# Patient Record
Sex: Male | Born: 1945 | Race: White | Hispanic: No | Marital: Married | Smoking: Never smoker
Health system: Southern US, Community
[De-identification: ages and names within clinical notes are randomized; demographics above are authoritative.]

## PROBLEM LIST (undated history)

## (undated) DIAGNOSIS — G4730 Sleep apnea, unspecified: Secondary | ICD-10-CM

## (undated) DIAGNOSIS — R7303 Prediabetes: Secondary | ICD-10-CM

## (undated) DIAGNOSIS — I739 Peripheral vascular disease, unspecified: Secondary | ICD-10-CM

## (undated) DIAGNOSIS — I2510 Atherosclerotic heart disease of native coronary artery without angina pectoris: Secondary | ICD-10-CM

## (undated) DIAGNOSIS — M1990 Unspecified osteoarthritis, unspecified site: Secondary | ICD-10-CM

## (undated) DIAGNOSIS — G4733 Obstructive sleep apnea (adult) (pediatric): Secondary | ICD-10-CM

## (undated) DIAGNOSIS — I4891 Unspecified atrial fibrillation: Secondary | ICD-10-CM

## (undated) DIAGNOSIS — I10 Essential (primary) hypertension: Secondary | ICD-10-CM

## (undated) DIAGNOSIS — M1612 Unilateral primary osteoarthritis, left hip: Secondary | ICD-10-CM

## (undated) DIAGNOSIS — R569 Unspecified convulsions: Secondary | ICD-10-CM

## (undated) HISTORY — PX: CHOLECYSTECTOMY: SHX55

## (undated) HISTORY — PX: EYE SURGERY: SHX253

## (undated) HISTORY — PX: TONSILLECTOMY: SUR1361

---

## 2006-11-21 ENCOUNTER — Observation Stay (HOSPITAL_COMMUNITY): Admission: EM | Admit: 2006-11-21 | Discharge: 2006-11-22 | Payer: Self-pay | Admitting: Emergency Medicine

## 2007-03-17 DIAGNOSIS — I739 Peripheral vascular disease, unspecified: Secondary | ICD-10-CM

## 2007-03-17 HISTORY — DX: Peripheral vascular disease, unspecified: I73.9

## 2007-03-17 HISTORY — PX: JOINT REPLACEMENT: SHX530

## 2007-03-24 ENCOUNTER — Encounter: Admission: RE | Admit: 2007-03-24 | Discharge: 2007-03-24 | Payer: Self-pay | Admitting: Sports Medicine

## 2007-05-30 ENCOUNTER — Inpatient Hospital Stay (HOSPITAL_COMMUNITY): Admission: RE | Admit: 2007-05-30 | Discharge: 2007-06-01 | Payer: Self-pay | Admitting: Orthopedic Surgery

## 2007-06-04 ENCOUNTER — Emergency Department (HOSPITAL_COMMUNITY): Admission: EM | Admit: 2007-06-04 | Discharge: 2007-06-04 | Payer: Self-pay | Admitting: Emergency Medicine

## 2007-06-04 ENCOUNTER — Encounter (INDEPENDENT_AMBULATORY_CARE_PROVIDER_SITE_OTHER): Payer: Self-pay | Admitting: Emergency Medicine

## 2007-06-04 ENCOUNTER — Ambulatory Visit: Payer: Self-pay | Admitting: *Deleted

## 2008-09-20 IMAGING — CR DG HIP COMPLETE 2+V*R*
2 series · 2 of 2 positions shown · non-contrast
Comparison: none

CLINICAL DATA: Right hip replacement. 
 RIGHT HIP W/PELVIS ? 05/30/07:

[view not recorded (1 of 2)]
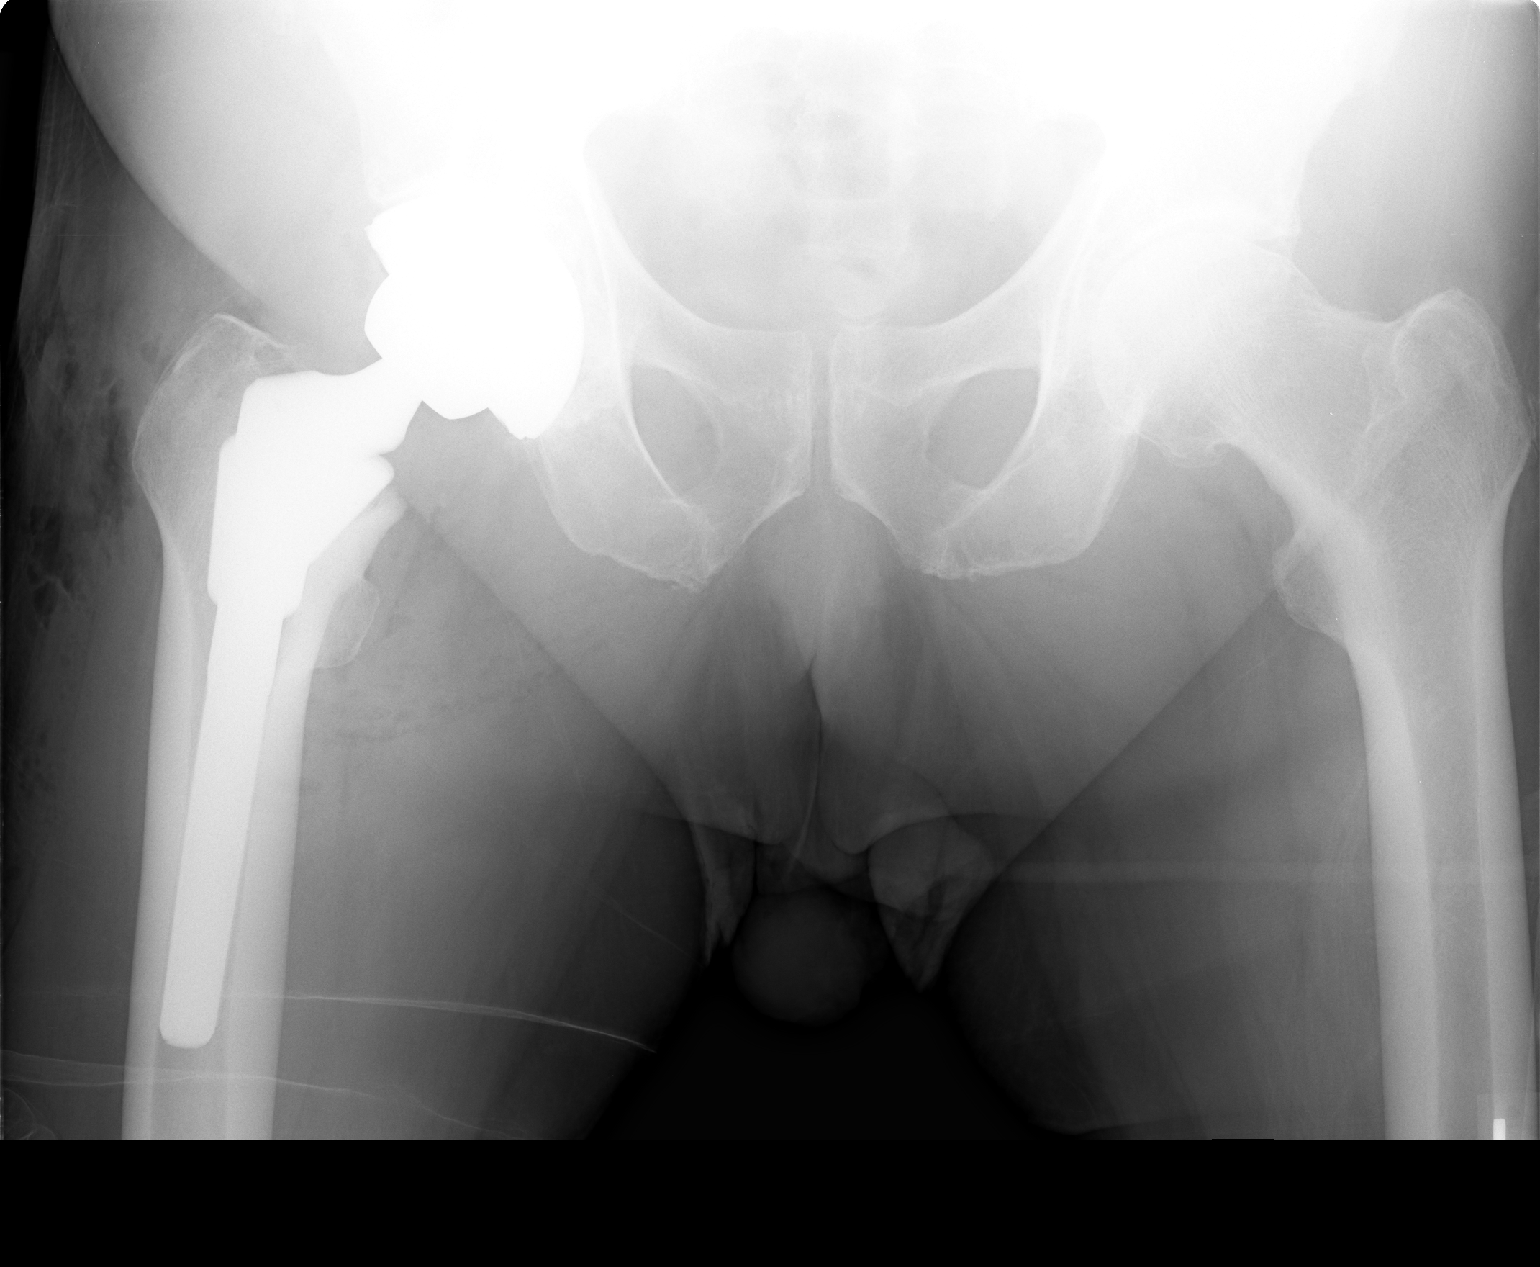

[view not recorded (2 of 2)]
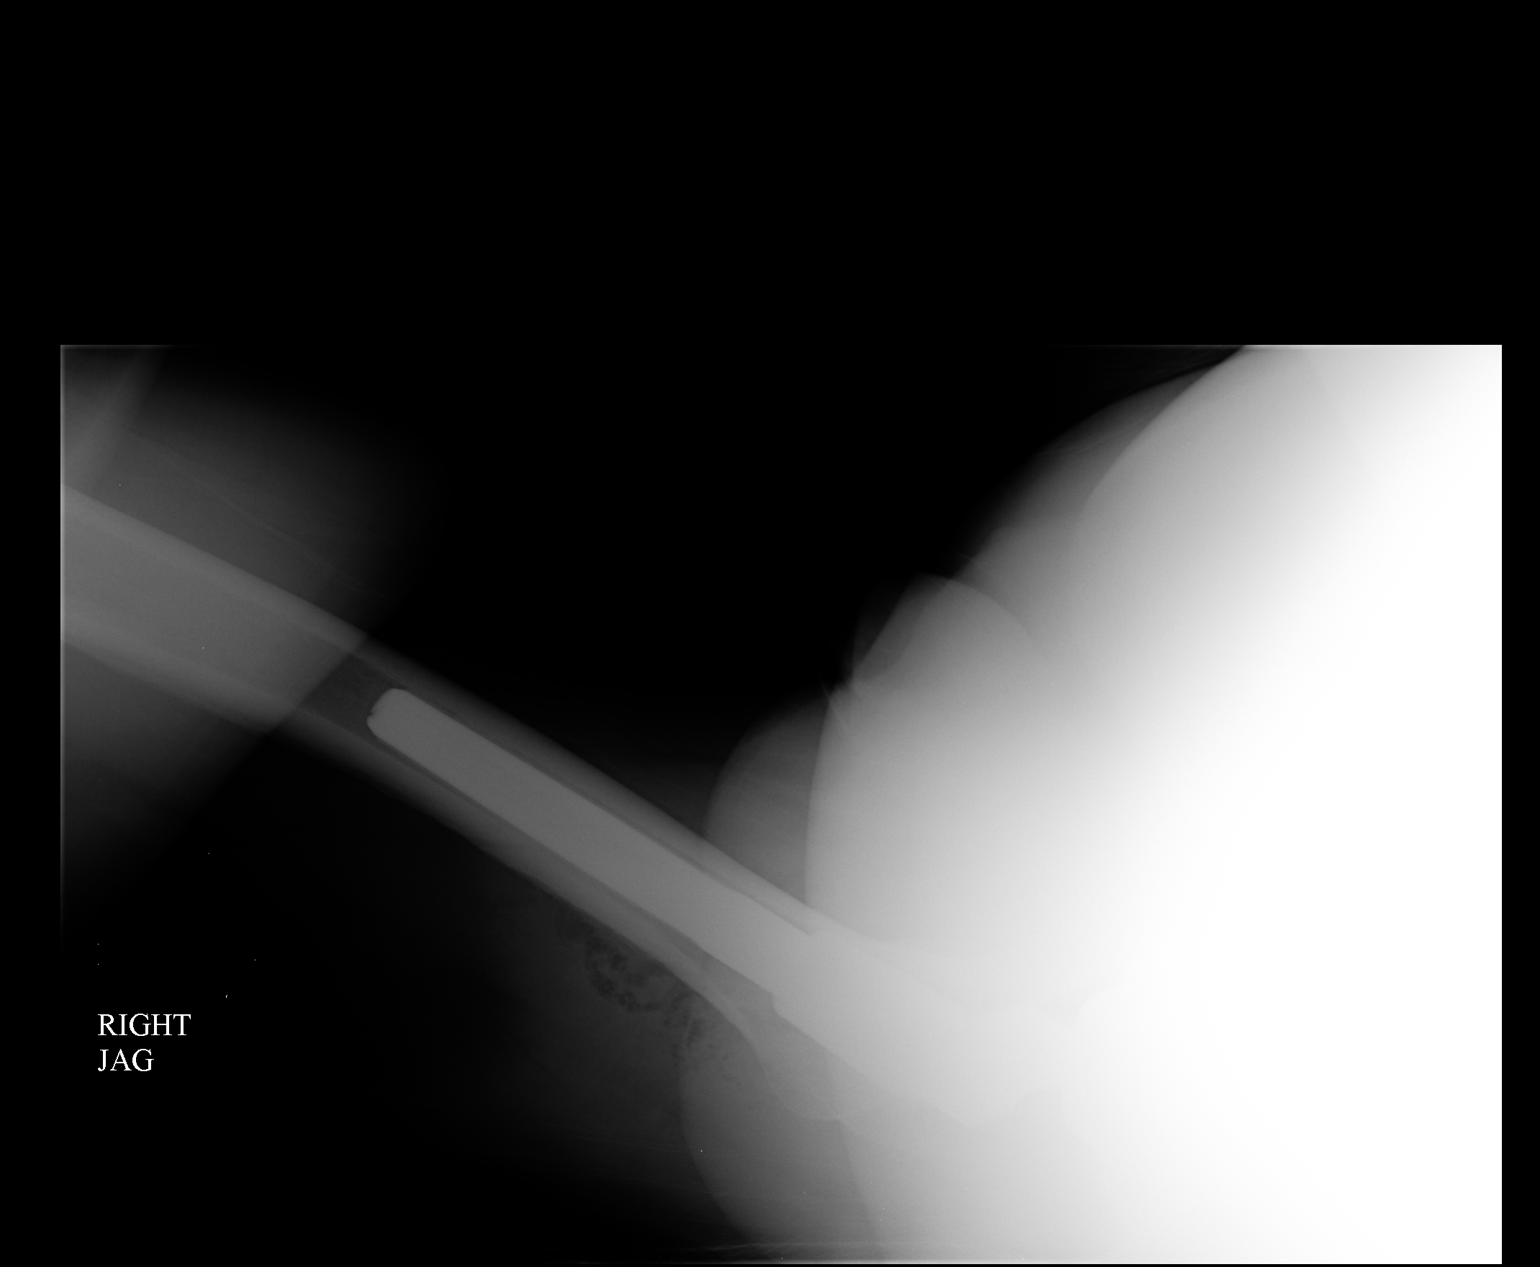

[2 of 2 positions shown; findings below may reference images not displayed]

FINDINGS: The patient has a total right hip arthroplasty in place. There is no fracture or other evidence of postprocedural complication with the device located.  Advanced appearing left hip osteoarthritis noted.
IMPRESSION: 1.  Right total hip without complicating features.  
 2.  Advanced left hip osteoarthritis.

## 2010-02-13 HISTORY — PX: CORONARY ARTERY BYPASS GRAFT: SHX141

## 2010-02-18 ENCOUNTER — Ambulatory Visit: Payer: Self-pay | Admitting: Thoracic Surgery (Cardiothoracic Vascular Surgery)

## 2010-04-02 ENCOUNTER — Ambulatory Visit: Admission: RE | Admit: 2010-04-02 | Discharge: 2010-04-02 | Payer: Self-pay | Source: Home / Self Care

## 2010-04-15 ENCOUNTER — Ambulatory Visit: Admission: RE | Admit: 2010-04-15 | Discharge: 2010-04-15 | Payer: Self-pay | Source: Home / Self Care

## 2010-04-16 NOTE — Assessment & Plan Note (Unsigned)
[place] OFFICE VISIT  Edward Rojas, Edward Rojas DOB:  Dec 10, 1945                                        April 15, 2010 CHART #:  [id number]  We saw the patient in the clinic today.  The patient had coronary artery bypass grafting on February 17, 2010, which he was recovering quite nicely from and when he came in for his routine postop visit, he was noted to be short of breath and he did have a moderate-sized left pleural effusion which was drained in the [facility] on April 02, 2010.  The patient comes in today having a repeat chest x-ray which shows a decreased left pleural effusion and he reports that he is feeling much much better with no shortness of breath.  He is walking for exercise.  His wounds are well healed.  His sternum is stable.  He has seen the cardiologist and he will continue to follow up with him.  He will be seeing Dr. Tereso Newcomer on April 30, 2010.  The patient was reminded of his sternal precautions, not to lift any more than 15 pounds for the next 6 weeks, and he will return to see [place] on a p.r.n. basis.  Tera Mater. Arvilla Market, MD  BC/MEDQ  D:  04/15/2010  T:  04/16/2010  Job:  [id number]

## 2010-07-29 NOTE — Assessment & Plan Note (Signed)
[place] [facility] VISIT   Edward Rojas, Edward Rojas  DOB:  [birth year]                                        April 02, 2010  CHART #:  [id number]   HISTORY:  The patient returned today for scheduled follow up after three-  vessel coronary bypass grafting by Dr. Arvilla Market on February 17, 2010.  Since  his discharge home, he has continued to have some shortness of breath  with exertion.  He was seen by Dr. Eulis Foster for evaluation of this and  he is noted to have moderate-sized left pleural effusion.  On a walking  test in Dr. Orma Flaming office with each lasting about 1 minute, the  patient decompensated to an oxygen saturation of 86% at the fourth lap.  He is otherwise making satisfactory progress.  He denies any further  angina.  He does complain of some persistent swelling in both lower  extremities and has been taking Lasix 80 mg a day as prescribed.   PHYSICAL EXAMINATION:  On exam, his heart is in a regular rate and  rhythm.  His breath sounds are clear to auscultation but obviously  diminished in the left base.  Chest x-ray obtained today is compared  with the film obtained on March 12, 2010, and shows no evidence of  resolution of moderate-sized left-sided pleural effusion.  Cardiac  mediastinal silhouette is not changed appreciably.  The lungs fields are  otherwise clear.  He has 3+ pitting edema in both lower extremities  extending up to the knee.  The leg incisions are intact and healing  satisfactorily as is his sternal incision.   PLAN:  Thoracentesis is offered to the patient and the procedure is  explained in detail along with its risks and complications.  He decided  to proceed with thoracentesis this afternoon.  He will be admitted to  the day hospital and left thoracentesis carried down under local  anesthesia.  The patient will follow up in our office in 10 days and as  needed.  He needs to continue with his current medications including the  Lasix and  potassium.  We also reviewed strong cautions and need for  steep elevation of his lower extremities when he is not otherwise  active.   Tera Mater. Arvilla Market, MD   MGR/MEDQ  D:  04/02/2010  T:  04/03/2010  Job:  [id number]

## 2010-07-29 NOTE — Discharge Summary (Signed)
Edward Rojas, Edward Rojas                 ACCOUNT NO.:  [id number]   MEDICAL RECORD NO.:  [id number]          PATIENT TYPE:  INP   LOCATION:  5001                         FACILITY:  [facility]   PHYSICIAN:  Robert A. Thurston Hole, M.D. DATE OF BIRTH:  1945-04-01   DATE OF ADMISSION:  05/30/2007  DATE OF DISCHARGE:  06/01/2007                               DISCHARGE SUMMARY   ADMITTING DIAGNOSES:  1. End-stage degenerative joint disease, right hip.  2. History of seizure disorder and hypertension.  3. History of sleep apnea.   DISCHARGE DIAGNOSES:  1. End-stage degenerative joint disease, right hip, status post total      hip replacement.  2. Vasovagal episode resolved with a fluid challenge.  3. Seizure disorder.  4. History of sleep apnea.  5. History of hypertension.   HISTORY OF PRESENT ILLNESS:  The patient is a 65 year old white male  with a history of end-stage DJD of his right hip.  He has failed  conservative care including intraarticular cortisone injections and anti-  inflammatories.  Risks, benefits and possible complications of a total  hip have been discussed with him and his wife.  They understand it and  are without question.   PROCEDURES IN HOUSE:  On May 30, 2007, the patient underwent a right  total hip replacement by Dr. Thurston Hole using a posterior approach.  He  tolerated the procedure well.  Postoperatively, he was placed on  stepdown due to his history of sleep apnea.  He has had a UPP 3 and no  longer uses a CPAP.  Due to the medical necessity for PCA IV narcotics  postop, we felt it was better that he be monitored on a stepdown unit.  He did very well overnight with no episodes of desaturation.  On postop  day 1, he was alert and oriented.  Surgical wound was well approximated.  He was afebrile.  Hemoglobin was 11.6, white cell count was 12.4.  He  was metabolically stable.  His IV was saline locked.  His PCA was  discontinued.  His Foley was discontinued.  His wound was  examined.  He  was weaned off O2.  He had a vasovagal goal episode in the afternoon  that responded nicely to a 500 mL bolus of normal saline.  His IV was  restarted at 100 mL an hour of normal saline with 20 of K.  He did very  well overnight.  On postop day 2, he is alert and oriented in the  morning.  Surgical wound is well approximated.  He has been up to the  bathroom to urinate with the assistance of his wife without difficulty.  No dizziness.  No shortness of breath.  No chest pain.  No headaches.   LABORATORY DATA:  On discharge, hemoglobin 11.2, hematocrit 33.1, and  white cell count of 11.1.  He is metabolically stable.   PHYSICAL EXAMINATION:  VITAL SIGNS:  T-max was 99.  Temperature on  discharge was 98.7.  Blood pressure 142/82, respirations were 18, and  pulse is 86.  GENERAL:  He was stable on exam.  Surgical wound was well approximated with a small amount of serous  drainage.  He tolerated physical therapy for ambulation and steps  without difficulty.  He was given a Dulcolax suppository to facilitate a  bowel movement prior to discharge.   DISCHARGE MEDICATIONS:  1. Tegretol 200 mg twice a day.  2. Toprol-XL 50 mg daily.  3. Dilaudid 2 mg 1-2 q.4-6 h. p.r.n. pain.  4. Robaxin 500 mg 1 q.4-6 h. as needed for muscle spasm.  5. Coumadin 5 mg 2 tablets at night until adjusted by Home Health      Pharmacy.  6. Colace 100 mg 1 tablet twice a day.  7. Senokot-S 2 tablets before dinner if has not had a bowel movement      in 2 days.   DISCHARGE INSTRUCTIONS:  He has been instructed not to take his Adalat  or his glucosamine due to being on Coumadin.  He will follow up with Dr.  Thurston Hole in the office on June 13, 2007.  He has 50% weightbearing with  posterior total hip precautions on a regular diet.      Kirstin Shepperson, P.A.      Robert A. Thurston Hole, M.D.  Electronically Signed    KS/MEDQ  D:  06/01/2007  T:  06/01/2007  Job:  [id number]

## 2010-07-29 NOTE — Discharge Summary (Signed)
NAMEBURMAN, BRUINGTON                 ACCOUNT NO.:  [id number]   MEDICAL RECORD NO.:  [id number]          PATIENT TYPE:  OBV   LOCATION:  3707                         FACILITY:  [facility]   PHYSICIAN:  Lyn Records, M.D.   DATE OF BIRTH:  [birth year]   DATE OF ADMISSION:  11/21/2006  DATE OF DISCHARGE:  11/22/2006                               DISCHARGE SUMMARY   DISCHARGE DIAGNOSES:  1. Tachy arrhythmia, no evidence of any further tachycardia while in      the hospital.  He does have a history of supraventricular      tachycardia from the past.  2. Pulmonary venous hypertension.  3. Mildly elevated troponin.   HOSPITAL COURSE:  Edward Rojas is a 65 year old male patient who has a  history of SVT over the past 10 years.  In the 5 hours prior to  admission, he had some increased heart rate and went to [facility]  for evaluation.  He was sent here for further evaluation.  By the time  he reached the emergency room, and during his hospitalization, his heart  rate remained normal.  His troponins did increase to a maximum of 0.22,  but CK MB were normal.  TSH was 1.15.  We monitored him overnight and  there were no other complications.  At this point, he is asymptomatic.  His troponins were minimally elevated, which could be multifactorial in  origin.  We have decided to let him go home.   Will place him on the following medications:  1. Enteric-coated aspirin 325 mg a day.  2. Tegretol 200 mg p.o. b.i.d. as prior to admission.   He has an appointment pending for an ultrasound of his heart on  November 25, 2006 at 1:15 p.m.  Following that study, at 2:15 p.m., he  will undergo a stress test.  He is instructed not to eat after 8:30 a.m.  on the day of the test.   After the study is completed, Dr. Katrinka Blazing and I plan on placing him on  metoprolol ER 25 mg daily, which will hopefully suppress his tachy  arrhythmia, and also lower his blood pressure which was 158/70 upon  discharge.  He has  not started the beta blocker yet because he will need  to be off of this for the stress test.   The patient is discharged to home in stable and improved condition.      Edward Rojas, P.A.      Lyn Records, M.D.  Electronically Signed    LB/MEDQ  D:  11/22/2006  T:  11/22/2006  Job:  [id number]

## 2010-07-29 NOTE — Op Note (Signed)
NAMEVESTER, Edward Rojas                 ACCOUNT NO.:  [id number]   MEDICAL RECORD NO.:  [id number]          PATIENT TYPE:  INP   LOCATION:  3301                         FACILITY:  [facility]   PHYSICIAN:  Elana Alm. Thurston Hole, M.D. DATE OF BIRTH:  [birth year]   DATE OF PROCEDURE:  05/30/2007  DATE OF DISCHARGE:                               OPERATIVE REPORT   PREOPERATIVE DIAGNOSIS:  Right hip degenerative joint disease.   POSTOPERATIVE DIAGNOSIS:  Right hip degenerative joint disease.   PROCEDURE:  Right total hip replacement using DePuy SROM Press-Fit total  hip prosthesis with acetabular component, 66 mm Pinnacle revision  acetabular cup with three locking screws with 60 x 40 mm metal liner.  Femoral component 20 x 15 femoral stem with 36 +12 femoral neck with 40  +0 femoral head with 20 F large ZTT sleeve.   SURGEON:  Elana Alm. Thurston Hole, M.D.   ASSISTANT:  Julien Girt, P.A.   ANESTHESIA:  General.   OPERATIVE TIME:  1 hour 45 minutes.   ESTIMATED BLOOD LOSS:  350 mL.   COMPLICATIONS:  None.   DESCRIPTION OF PROCEDURE:  Edward Rojas was brought to operating room on  May 30, 2007, placed on the operative table in supine position.  He  received Ancef 2 grams IV preoperatively for prophylaxis.  After being  placed under general anesthesia, he had a Foley catheter placed under  sterile conditions.  He then had his hip examined under anesthesia.  He  had flexion to 90, extension to -20, internal-external rotation of 10  degrees with approximately equal leg lengths.  Pulses 2+ and symmetric.  He was then turned in the right lateral up decubitus position and  secured on the bed with a Mark frame.  His right hip and leg was then  prepped using sterile DuraPrep and draped using sterile technique.  Originally through a 15 cm posterolateral greater trochanteric incision  initial exposure was made.  The underlying subcutaneous tissues were  incised along with skin incision.  The iliotibial  band and gluteus  maximus fascia was incised longitudinally revealing the underlying  sciatic nerve which was carefully protected.  The short external  rotators of the hip and hip capsule were released off the femoral neck  insertion and tagged and then the hip was then posteriorly dislocated.  It was found to have severe grade 3 and 4 DJD with misshaping of the  femoral head.  Femoral neck cut was then made 1.5 to 2 cm above the  lesser trochanter and the appropriate manner anteversion.  The femoral  head was removed.  Carefully placed retractors were then placed around  the acetabulum.  He was found to have grade 3 and 4 DJD noted in the  acetabulum as well.  Degenerative acetabular labrum was resected.  Sequential acetabular reamers were then used to ream up to a 65 mm size  and with a 66 mm trial there was found to be a satisfactory and  excellent fit in the appropriate amount of anteversion, abduction and  inclination.  At this point a 66 mm Pinnacle revision  cup was then  hammered into position in the appropriate amount of anteversion,  abduction and inclination with a satisfactory fit.  It was further  secured in place with three separate locking screws two in the 12  o'clock and one in the 11 o'clock position.  One of these was 30 mm in  length and two were 25 mm with excellent fit, further securing the  acetabulum in place.  The metal liner was then placed into the  acetabular shell and tapped into position with an excellent Morse taper  fit.  At this point then the femoral canal was exposed and sequential  reaming was carried out to a 15.5 mm size, proximal reaming carried out  to a 20 F large and with the 20 F large trial sleeve and a 20 x 15  femoral stem trial with the 36 +12 femoral neck and a +0 x 40 mm head,  the hip was reduced, taken through full range of motion, found to be  stable up to 80 degrees of internal rotation in both neutral and 30  degrees of adduction and  also stable in abduction external rotation and  leg lengths equal.  At this point the femoral trial was removed and then  the actual 20 F large ZTT sleeve was hammered in position with an  excellent fit and a 20 x 15 femoral stem with a 36 +12 femoral neck  offset was hammered into position with an additional 15 degrees of  anteversion for stability which is what had been trialed as well.  The  45 +0 femoral head was then hammered on the femoral neck with an  excellent Morse taper fit and then the hip reduced again taken through  full range of motion, found to be stable and leg lengths equal, stable  up to 80 degrees of internal rotation in both neutral and 30 degrees of  adduction, also stable in abduction external rotation and leg lengths  equal.  At this point it was felt that all components were of excellent  size, fit and stability.  The wound was further irrigated and then the  hip capsule and short external rotators of the hip were reattached to  their femoral neck insertion through two drill holes in the greater  trochanter.  The iliotibial band and gluteus maximus fascia was closed  with #1 Ethibond suture.  Subcutaneous tissues closed with 0 and 2-0  Vicryl, subcuticular layer closed with 4-0 Monocryl.  Sterile dressings  were applied.  The patient then turned supine, checked for leg lengths  that were equal, rotation equal, pulses 2+ and symmetric.  He was then  awakened, extubated, and taken to recovery room in stable condition.  Needle and sponge count was correct x2 at the end of the case.      Robert A. Thurston Hole, M.D.  Electronically Signed     RAW/MEDQ  D:  05/30/2007  T:  05/31/2007  Job:  [id number]

## 2010-07-29 NOTE — H&P (Signed)
Edward Rojas, Edward Rojas NO.:  [id number]   MEDICAL RECORD NO.:  [id number]          PATIENT TYPE:  EMS   LOCATION:  MAJO                         FACILITY:  [facility]   PHYSICIAN:  Lyn Records, M.D.   DATE OF BIRTH:  October 04, 1945   DATE OF ADMISSION:  11/21/2006  DATE OF DISCHARGE:                              HISTORY & PHYSICAL   REASON FOR ADMISSION:  Abnormal cardiac markers.   SUBJECTIVE:  Edward Rojas is a 65 year old gentleman patient of Dr. Leonides Sake, who, for the past ten years, has had episodes of increased heart  rate.  On two prior occasions, he has gone to the emergency room with  these.  On one occasion, the arrhythmia stopped before he could be  placed on an EKG.  Approximately ten years ago, he went to [facility]  Emergency Room and was on a monitor, and before any medications were  given, he broke.  Today, he was doing volunteer work with his church.  He was doing some mopping and noticed that his heart rate increased.  The only way he notices is by palpating his pulse.  According to him,  this is how he always notices when his heart rate is fast.  He denies  any specific symptoms.  No chest pain, orthopnea, PND, tachy  palpitations, lightheadedness, dizziness, or syncope.  He states that  after he began mopping, he noticed that his heart rate was greater than  130.  He continued to do the work that he had to do at his church, went  home, and took a nap.  When he awakened from his nap, he noticed that  the heart rate was still above 130.  This led him to go to the [place]  walk-in clinic where he was seen by Dr. Henrine Screws.  Dr. Abigail Miyamoto  evaluated the patient, got an EKG, noticed that there was some possible  ST segment changes on the EKG, and sent him to the [facility] Emergency  Room.  In the [facility] ER, he was noted to be in normal sinus rhythm, as per  his EKG from [facility] walk-in clinic, but I was called because his cardiac  troponin point-of-care  marker was 0.11.  I asked them to repeat this,  and it was still elevated on the second occasion at 0.11.  Because of  this, he will be admitted for observation.   MEDICAL HISTORY:  Petit mal seizures.  Past medical history is otherwise  unremarkable.   MEDICATIONS:  Tegretol 200 mg by mouth twice a day.   ALLERGIES:  NONE.   HABITS:  He is a nonsmoker/drinker.   FAMILY HISTORY:  Family history negative for premature atherosclerosis.   REVIEW OF SYSTEMS:  He denies diabetes, hyperlipidemia, hypertension, or  other clinical medical problems of any significance.   PHYSICAL EXAMINATION:  GENERAL:  The patient is in no acute distress.  He is laying comfortably on the gurney, watching the football game on  TV.  VITAL SIGNS:  His blood pressure is 120/70, heart rate is 60.  HEENT:  Unremarkable.  NECK:  No JVD, carotid bruits, or thyromegaly.  CARDIAC:  No gallops, no rubs, no clicks.  ABDOMEN:  Soft, bowel sounds normal.  EXTREMITIES:  No edema.  Posterior tibial pulses 2+.  NEUROLOGIC:  Unremarkable.   LABORATORY DATA:  Chest x-ray reveals cardiac enlargement, pulmonary  venous hypertension, and no obvious CHF.  Other laboratory data is  pending.  EKG reveals diffuse ST elevation, could be normal variant.  Troponin point-of-care is 0.11 x 2.   ASSESSMENT:  1. Tachycardia, now resolved, documented only by the patient's      palpation of his radial pulse.  Prior history of some form of      tachycardia documented at least once over the past ten years in the      [facility] Emergency Room.  2. History of petit mal seizures.  3. Two elevated troponin-I point-of-care markers of uncertain      significance.   PLAN:  1. Serial cardiac markers will be obtained.  2. The patient will be admitted for clinical observation to rule out      myocardial infarction.  3. May require further cardiac evaluation depending upon database.  4. Start Lovenox.      Lyn Records, M.D.   Electronically Signed     HWS/MEDQ  D:  11/21/2006  T:  11/21/2006  Job:  [id number]

## 2010-12-08 LAB — CBC
HCT: 33.1 — ABNORMAL LOW
HCT: 33.7 — ABNORMAL LOW
HCT: 36.5 — ABNORMAL LOW
Hemoglobin: 11.2 — ABNORMAL LOW
Hemoglobin: 11.3 — ABNORMAL LOW
Hemoglobin: 12.4 — ABNORMAL LOW
MCHC: 33.9
MCHC: 33.4
MCHC: 34
MCV: 91.3
MCV: 91.3
MCV: 91.3
MCV: 91.5
Platelets: 258
RBC: 5.28
RBC: 3.62 — ABNORMAL LOW
RDW: 13.2
RDW: 12.6
RDW: 13.1
WBC: 11.6 — ABNORMAL HIGH

## 2010-12-08 LAB — BASIC METABOLIC PANEL
BUN: 10
BUN: 14
CO2: 31
CO2: 33 — ABNORMAL HIGH
Chloride: 102
Creatinine, Ser: 0.9
Glucose, Bld: 132 — ABNORMAL HIGH
Potassium: 4.8
Potassium: 4.9
Sodium: 140

## 2010-12-08 LAB — URINALYSIS, ROUTINE W REFLEX MICROSCOPIC
Glucose, UA: NEGATIVE
Hgb urine dipstick: NEGATIVE
Protein, ur: NEGATIVE
Specific Gravity, Urine: 1.017

## 2010-12-08 LAB — PROTIME-INR
INR: 1.4
INR: 1.7 — ABNORMAL HIGH
Prothrombin Time: 13.1
Prothrombin Time: 20.4 — ABNORMAL HIGH

## 2010-12-08 LAB — COMPREHENSIVE METABOLIC PANEL
ALT: 23
AST: 22
Albumin: 4.2
Alkaline Phosphatase: 118 — ABNORMAL HIGH
Calcium: 9.8
Glucose, Bld: 91
Potassium: 4.2
Sodium: 141
Total Protein: 7.8

## 2010-12-08 LAB — I-STAT 8, (EC8 V) (CONVERTED LAB)
BUN: 16
Bicarbonate: 27.7 — ABNORMAL HIGH
Glucose, Bld: 100 — ABNORMAL HIGH
Hemoglobin: 11.9 — ABNORMAL LOW
Sodium: 134 — ABNORMAL LOW
pH, Ven: 7.425 — ABNORMAL HIGH

## 2010-12-08 LAB — URINE CULTURE: Culture: NO GROWTH

## 2010-12-08 LAB — POCT I-STAT CREATININE: Creatinine, Ser: 0.9

## 2010-12-08 LAB — TYPE AND SCREEN

## 2010-12-08 LAB — DIFFERENTIAL
Eosinophils Absolute: 0.1
Eosinophils Relative: 2
Lymphocytes Relative: 20
Lymphs Abs: 1.7
Monocytes Absolute: 0.7
Monocytes Relative: 9

## 2010-12-08 LAB — ABO/RH: ABO/RH(D): B POS

## 2010-12-26 LAB — CK TOTAL AND CKMB (NOT AT ARMC)
CK, MB: 2.6
CK, MB: 3.4
Relative Index: INVALID
Relative Index: INVALID
Total CK: 64
Total CK: 82

## 2010-12-26 LAB — POCT CARDIAC MARKERS
CKMB, poc: 2.2
CKMB, poc: 2.3
Myoglobin, poc: 66.4
Myoglobin, poc: 76.8
Operator id: 265201
Operator id: 265201
Troponin i, poc: 0.11 — ABNORMAL HIGH
Troponin i, poc: 0.11 — ABNORMAL HIGH

## 2010-12-26 LAB — COMPREHENSIVE METABOLIC PANEL
ALT: 23
AST: 24
Alkaline Phosphatase: 100
CO2: 31
Chloride: 101
Creatinine, Ser: 0.78
GFR calc Af Amer: >60
GFR calc non Af Amer: >60
Potassium: 4.3
Sodium: 140
Total Bilirubin: 0.6

## 2010-12-26 LAB — DIFFERENTIAL
Basophils Absolute: 0
Basophils Relative: 0
Monocytes Relative: 10
Neutro Abs: 5.9
Neutrophils Relative %: 68

## 2010-12-26 LAB — I-STAT 8, (EC8 V) (CONVERTED LAB)
Acid-Base Excess: 2
BUN: 16
Bicarbonate: 27.7 — ABNORMAL HIGH
Chloride: 106
Glucose, Bld: 95
HCT: 51
Hemoglobin: 17.3 — ABNORMAL HIGH
Operator id: 265201
Potassium: 4.3
Sodium: 139
TCO2: 29
pCO2, Ven: 47.2
pH, Ven: 7.376 — ABNORMAL HIGH

## 2010-12-26 LAB — TROPONIN I: Troponin I: 0.12 — ABNORMAL HIGH

## 2010-12-26 LAB — B-NATRIURETIC PEPTIDE (CONVERTED LAB): Pro B Natriuretic peptide (BNP): 126 — ABNORMAL HIGH

## 2010-12-26 LAB — PROTIME-INR
INR: 1
Prothrombin Time: 13.3

## 2010-12-26 LAB — CBC
MCHC: 33.2
RBC: 5.28
RDW: 12.4

## 2010-12-26 LAB — TSH: TSH: 1.51

## 2014-04-04 DIAGNOSIS — H2511 Age-related nuclear cataract, right eye: Secondary | ICD-10-CM | POA: Diagnosis not present

## 2014-05-04 DIAGNOSIS — R569 Unspecified convulsions: Secondary | ICD-10-CM | POA: Diagnosis not present

## 2014-05-04 DIAGNOSIS — I10 Essential (primary) hypertension: Secondary | ICD-10-CM | POA: Diagnosis not present

## 2014-05-04 DIAGNOSIS — E119 Type 2 diabetes mellitus without complications: Secondary | ICD-10-CM | POA: Diagnosis not present

## 2014-05-04 DIAGNOSIS — E785 Hyperlipidemia, unspecified: Secondary | ICD-10-CM | POA: Diagnosis not present

## 2014-08-15 DIAGNOSIS — L03115 Cellulitis of right lower limb: Secondary | ICD-10-CM | POA: Diagnosis not present

## 2014-11-02 DIAGNOSIS — I10 Essential (primary) hypertension: Secondary | ICD-10-CM | POA: Diagnosis not present

## 2014-11-02 DIAGNOSIS — E119 Type 2 diabetes mellitus without complications: Secondary | ICD-10-CM | POA: Diagnosis not present

## 2014-11-02 DIAGNOSIS — E785 Hyperlipidemia, unspecified: Secondary | ICD-10-CM | POA: Diagnosis not present

## 2014-11-02 DIAGNOSIS — R569 Unspecified convulsions: Secondary | ICD-10-CM | POA: Diagnosis not present

## 2014-11-02 DIAGNOSIS — Z125 Encounter for screening for malignant neoplasm of prostate: Secondary | ICD-10-CM | POA: Diagnosis not present

## 2014-11-05 DIAGNOSIS — H40053 Ocular hypertension, bilateral: Secondary | ICD-10-CM | POA: Diagnosis not present

## 2014-11-05 DIAGNOSIS — H2512 Age-related nuclear cataract, left eye: Secondary | ICD-10-CM | POA: Diagnosis not present

## 2014-11-05 DIAGNOSIS — E119 Type 2 diabetes mellitus without complications: Secondary | ICD-10-CM | POA: Diagnosis not present

## 2014-11-14 DIAGNOSIS — H259 Unspecified age-related cataract: Secondary | ICD-10-CM | POA: Diagnosis not present

## 2014-11-14 DIAGNOSIS — H2512 Age-related nuclear cataract, left eye: Secondary | ICD-10-CM | POA: Diagnosis not present

## 2014-12-06 DIAGNOSIS — G4733 Obstructive sleep apnea (adult) (pediatric): Secondary | ICD-10-CM | POA: Diagnosis not present

## 2014-12-12 DIAGNOSIS — I10 Essential (primary) hypertension: Secondary | ICD-10-CM | POA: Diagnosis not present

## 2014-12-12 DIAGNOSIS — G4733 Obstructive sleep apnea (adult) (pediatric): Secondary | ICD-10-CM | POA: Diagnosis not present

## 2014-12-12 DIAGNOSIS — I25810 Atherosclerosis of coronary artery bypass graft(s) without angina pectoris: Secondary | ICD-10-CM | POA: Diagnosis not present

## 2014-12-12 DIAGNOSIS — E782 Mixed hyperlipidemia: Secondary | ICD-10-CM | POA: Diagnosis not present

## 2014-12-12 DIAGNOSIS — E6609 Other obesity due to excess calories: Secondary | ICD-10-CM | POA: Diagnosis not present

## 2014-12-13 DIAGNOSIS — Z0100 Encounter for examination of eyes and vision without abnormal findings: Secondary | ICD-10-CM | POA: Diagnosis not present

## 2014-12-19 DIAGNOSIS — I10 Essential (primary) hypertension: Secondary | ICD-10-CM | POA: Diagnosis not present

## 2014-12-19 DIAGNOSIS — L989 Disorder of the skin and subcutaneous tissue, unspecified: Secondary | ICD-10-CM | POA: Diagnosis not present

## 2015-01-10 DIAGNOSIS — L821 Other seborrheic keratosis: Secondary | ICD-10-CM | POA: Diagnosis not present

## 2015-01-10 DIAGNOSIS — L578 Other skin changes due to chronic exposure to nonionizing radiation: Secondary | ICD-10-CM | POA: Diagnosis not present

## 2015-01-10 DIAGNOSIS — L570 Actinic keratosis: Secondary | ICD-10-CM | POA: Diagnosis not present

## 2015-01-11 DIAGNOSIS — D485 Neoplasm of uncertain behavior of skin: Secondary | ICD-10-CM | POA: Diagnosis not present

## 2015-01-11 DIAGNOSIS — C44311 Basal cell carcinoma of skin of nose: Secondary | ICD-10-CM | POA: Diagnosis not present

## 2015-01-11 DIAGNOSIS — L570 Actinic keratosis: Secondary | ICD-10-CM | POA: Diagnosis not present

## 2015-01-11 DIAGNOSIS — L820 Inflamed seborrheic keratosis: Secondary | ICD-10-CM | POA: Diagnosis not present

## 2015-03-07 DIAGNOSIS — C44311 Basal cell carcinoma of skin of nose: Secondary | ICD-10-CM | POA: Diagnosis not present

## 2015-03-21 DIAGNOSIS — G4733 Obstructive sleep apnea (adult) (pediatric): Secondary | ICD-10-CM | POA: Diagnosis not present

## 2015-05-22 DIAGNOSIS — E119 Type 2 diabetes mellitus without complications: Secondary | ICD-10-CM | POA: Diagnosis not present

## 2015-05-22 DIAGNOSIS — M25552 Pain in left hip: Secondary | ICD-10-CM | POA: Diagnosis not present

## 2015-05-22 DIAGNOSIS — Z7984 Long term (current) use of oral hypoglycemic drugs: Secondary | ICD-10-CM | POA: Diagnosis not present

## 2015-05-22 DIAGNOSIS — E785 Hyperlipidemia, unspecified: Secondary | ICD-10-CM | POA: Diagnosis not present

## 2015-05-22 DIAGNOSIS — I10 Essential (primary) hypertension: Secondary | ICD-10-CM | POA: Diagnosis not present

## 2015-05-22 DIAGNOSIS — R569 Unspecified convulsions: Secondary | ICD-10-CM | POA: Diagnosis not present

## 2015-06-06 DIAGNOSIS — M545 Low back pain: Secondary | ICD-10-CM | POA: Diagnosis not present

## 2015-06-06 DIAGNOSIS — M25552 Pain in left hip: Secondary | ICD-10-CM | POA: Diagnosis not present

## 2015-07-02 DIAGNOSIS — G4733 Obstructive sleep apnea (adult) (pediatric): Secondary | ICD-10-CM | POA: Diagnosis not present

## 2015-08-14 DIAGNOSIS — E119 Type 2 diabetes mellitus without complications: Secondary | ICD-10-CM | POA: Diagnosis not present

## 2015-08-22 DIAGNOSIS — R569 Unspecified convulsions: Secondary | ICD-10-CM | POA: Diagnosis not present

## 2015-08-22 DIAGNOSIS — I10 Essential (primary) hypertension: Secondary | ICD-10-CM | POA: Diagnosis not present

## 2015-08-22 DIAGNOSIS — E785 Hyperlipidemia, unspecified: Secondary | ICD-10-CM | POA: Diagnosis not present

## 2015-08-22 DIAGNOSIS — E119 Type 2 diabetes mellitus without complications: Secondary | ICD-10-CM | POA: Diagnosis not present

## 2015-12-03 DIAGNOSIS — E119 Type 2 diabetes mellitus without complications: Secondary | ICD-10-CM | POA: Diagnosis not present

## 2015-12-03 DIAGNOSIS — R569 Unspecified convulsions: Secondary | ICD-10-CM | POA: Diagnosis not present

## 2015-12-03 DIAGNOSIS — I10 Essential (primary) hypertension: Secondary | ICD-10-CM | POA: Diagnosis not present

## 2015-12-03 DIAGNOSIS — Z125 Encounter for screening for malignant neoplasm of prostate: Secondary | ICD-10-CM | POA: Diagnosis not present

## 2015-12-03 DIAGNOSIS — E785 Hyperlipidemia, unspecified: Secondary | ICD-10-CM | POA: Diagnosis not present

## 2015-12-09 DIAGNOSIS — M1712 Unilateral primary osteoarthritis, left knee: Secondary | ICD-10-CM | POA: Diagnosis not present

## 2015-12-09 DIAGNOSIS — M25552 Pain in left hip: Secondary | ICD-10-CM | POA: Diagnosis not present

## 2015-12-26 DIAGNOSIS — Z9989 Dependence on other enabling machines and devices: Secondary | ICD-10-CM | POA: Diagnosis not present

## 2015-12-26 DIAGNOSIS — I10 Essential (primary) hypertension: Secondary | ICD-10-CM | POA: Diagnosis not present

## 2015-12-26 DIAGNOSIS — E6609 Other obesity due to excess calories: Secondary | ICD-10-CM | POA: Diagnosis not present

## 2015-12-26 DIAGNOSIS — G4733 Obstructive sleep apnea (adult) (pediatric): Secondary | ICD-10-CM | POA: Diagnosis not present

## 2015-12-26 DIAGNOSIS — E782 Mixed hyperlipidemia: Secondary | ICD-10-CM | POA: Diagnosis not present

## 2015-12-26 DIAGNOSIS — I25810 Atherosclerosis of coronary artery bypass graft(s) without angina pectoris: Secondary | ICD-10-CM | POA: Diagnosis not present

## 2015-12-30 DIAGNOSIS — Z9989 Dependence on other enabling machines and devices: Secondary | ICD-10-CM | POA: Diagnosis not present

## 2015-12-30 DIAGNOSIS — G4733 Obstructive sleep apnea (adult) (pediatric): Secondary | ICD-10-CM | POA: Diagnosis not present

## 2016-01-06 DIAGNOSIS — M1712 Unilateral primary osteoarthritis, left knee: Secondary | ICD-10-CM | POA: Diagnosis not present

## 2016-01-15 DIAGNOSIS — G4733 Obstructive sleep apnea (adult) (pediatric): Secondary | ICD-10-CM | POA: Diagnosis not present

## 2016-02-17 DIAGNOSIS — H40053 Ocular hypertension, bilateral: Secondary | ICD-10-CM | POA: Diagnosis not present

## 2016-02-24 DIAGNOSIS — G4733 Obstructive sleep apnea (adult) (pediatric): Secondary | ICD-10-CM | POA: Diagnosis not present

## 2016-03-02 DIAGNOSIS — E119 Type 2 diabetes mellitus without complications: Secondary | ICD-10-CM | POA: Diagnosis not present

## 2016-03-02 DIAGNOSIS — E785 Hyperlipidemia, unspecified: Secondary | ICD-10-CM | POA: Diagnosis not present

## 2016-03-02 DIAGNOSIS — R569 Unspecified convulsions: Secondary | ICD-10-CM | POA: Diagnosis not present

## 2016-03-02 DIAGNOSIS — I10 Essential (primary) hypertension: Secondary | ICD-10-CM | POA: Diagnosis not present

## 2016-03-16 DIAGNOSIS — G4733 Obstructive sleep apnea (adult) (pediatric): Secondary | ICD-10-CM | POA: Diagnosis not present

## 2016-04-08 ENCOUNTER — Other Ambulatory Visit: Payer: Self-pay | Admitting: Orthopedic Surgery

## 2016-04-10 ENCOUNTER — Encounter (HOSPITAL_COMMUNITY): Payer: Self-pay

## 2016-04-10 ENCOUNTER — Encounter (HOSPITAL_COMMUNITY)
Admission: RE | Admit: 2016-04-10 | Discharge: 2016-04-10 | Disposition: A | Discharge status: Home / Self Care | Payer: Medicare HMO | Source: Ambulatory Visit | Attending: Orthopedic Surgery | Admitting: Orthopedic Surgery

## 2016-04-10 DIAGNOSIS — Z01812 Encounter for preprocedural laboratory examination: Secondary | ICD-10-CM | POA: Insufficient documentation

## 2016-04-10 DIAGNOSIS — M13852 Other specified arthritis, left hip: Secondary | ICD-10-CM | POA: Insufficient documentation

## 2016-04-10 DIAGNOSIS — Z01810 Encounter for preprocedural cardiovascular examination: Secondary | ICD-10-CM | POA: Insufficient documentation

## 2016-04-10 HISTORY — DX: Unspecified convulsions: R56.9

## 2016-04-10 HISTORY — DX: Essential (primary) hypertension: I10

## 2016-04-10 HISTORY — DX: Obstructive sleep apnea (adult) (pediatric): G47.33

## 2016-04-10 HISTORY — DX: Unspecified osteoarthritis, unspecified site: M19.90

## 2016-04-10 HISTORY — DX: Peripheral vascular disease, unspecified: I73.9

## 2016-04-10 HISTORY — DX: Unspecified atrial fibrillation: I48.91

## 2016-04-10 HISTORY — DX: Sleep apnea, unspecified: G47.30

## 2016-04-10 HISTORY — DX: Prediabetes: R73.03

## 2016-04-10 HISTORY — DX: Atherosclerotic heart disease of native coronary artery without angina pectoris: I25.10

## 2016-04-10 LAB — BASIC METABOLIC PANEL
ANION GAP: 10 (ref 5–15)
BUN: 15 mg/dL (ref 6–20)
CO2: 24 mmol/L (ref 22–32)
Calcium: 9.4 mg/dL (ref 8.9–10.3)
Chloride: 103 mmol/L (ref 101–111)
Creatinine, Ser: 0.93 mg/dL (ref 0.61–1.24)
GFR calc non Af Amer: >60 mL/min (ref >60)
GLUCOSE: 148 mg/dL — AB (ref 65–99)
POTASSIUM: 4.1 mmol/L (ref 3.5–5.1)
Sodium: 137 mmol/L (ref 135–145)

## 2016-04-10 LAB — CBC
HCT: 45.7 % (ref 39.0–52.0)
HEMOGLOBIN: 15.2 g/dL (ref 13.0–17.0)
MCH: 31 pg (ref 26.0–34.0)
MCHC: 33.3 g/dL (ref 30.0–36.0)
MCV: 93.3 fL (ref 78.0–100.0)
Platelets: 225 10*3/uL (ref 150–400)
RBC: 4.9 MIL/uL (ref 4.22–5.81)
RDW: 12.7 % (ref 11.5–15.5)
WBC: 8.1 10*3/uL (ref 4.0–10.5)

## 2016-04-10 LAB — SURGICAL PCR SCREEN
MRSA, PCR: NEGATIVE
STAPHYLOCOCCUS AUREUS: NEGATIVE

## 2016-04-10 NOTE — Pre-Procedure Instructions (Addendum)
Edward Rojas  04/10/2016      [id number] - [place], [place] - [address] AT [place] [address] Phone: [phone] Fax: [phone]    Your procedure is scheduled on2/6/18.  Report to [facility] Admitting at 530 A.M.  Call this number if you have problems the morning of surgery:  [phone]   Remember:  Do not eat food or drink liquids after midnight.  Take these medicines the morning of surgery with A SIP OF WATER     None  STOP all herbel meds, nsaids (aleve,naproxen,advil,ibuprofen) 7 days prior to surgery starting 04/14/16 including aspirin,all vitamins/supplements   Do not wear jewelry, make-up or nail polish.  Do not wear lotions, powders, or perfumes, or deoderant.  Do not shave 48 hours prior to surgery.  Men may shave face and neck.  Do not bring valuables to the hospital.  [facility] is not responsible for any belongings or valuables.  Contacts, dentures or bridgework may not be worn into surgery.  Leave your suitcase in the car.  After surgery it may be brought to your room.  For patients admitted to the hospital, discharge time will be determined by your treatment team.  Patients discharged the day of surgery will not be allowed to drive home.   Special instructions:   Special Instructions: [facility] - Preparing for Surgery  Before surgery, you can play an important role.  Because skin is not sterile, your skin needs to be as free of germs as possible.  You can reduce the number of germs on you skin by washing with CHG (chlorahexidine gluconate) soap before surgery.  CHG is an antiseptic cleaner which kills germs and bonds with the skin to continue killing germs even after washing.  Please DO NOT use if you have an allergy to CHG or antibacterial soaps.  If your skin becomes reddened/irritated stop using the CHG and inform your nurse when you arrive at Short Stay.  Do not shave  (including legs and underarms) for at least 48 hours prior to the first CHG shower.  You may shave your face.  Please follow these instructions carefully:   1.  Shower with CHG Soap the night before surgery and the morning of Surgery.  2.  If you choose to wash your hair, wash your hair first as usual with your normal shampoo.  3.  After you shampoo, rinse your hair and body thoroughly to remove the Shampoo.  4.  Use CHG as you would any other liquid soap.  You can apply chg directly  to the skin and wash gently with scrungie or a clean washcloth.  5.  Apply the CHG Soap to your body ONLY FROM THE NECK DOWN.  Do not use on open wounds or open sores.  Avoid contact with your eyes ears, mouth and genitals (private parts).  Wash genitals (private parts)       with your normal soap.  6.  Wash thoroughly, paying special attention to the area where your surgery will be performed.  7.  Thoroughly rinse your body with warm water from the neck down.  8.  DO NOT shower/wash with your normal soap after using and rinsing off the CHG Soap.  9.  Pat yourself dry with a clean towel.            10.  Wear clean pajamas.  11.  Place clean sheets on your bed the night of your first shower and do not sleep with pets.  Day of Surgery  Do not apply any lotions/deodorants the morning of surgery.  Please wear clean clothes to the hospital/surgery center.  Please read over the  fact sheets that you were given.

## 2016-04-11 LAB — HEMOGLOBIN A1C
HEMOGLOBIN A1C: 7.4 % — AB (ref 4.8–5.6)
MEAN PLASMA GLUCOSE: 166 mg/dL

## 2016-04-14 ENCOUNTER — Encounter (HOSPITAL_COMMUNITY): Payer: Self-pay

## 2016-04-14 NOTE — Progress Notes (Addendum)
Anesthesia Chart Review:  Pt is a 71 year old male scheduled for L total hip arthroplasty on 04/21/2016 with Marchia Bond, M.D.  - PCP is Shirline Frees, MD - Cardiologist is Towana Badger, MD, last office visit 12/26/15 (notes in care everywhere). Yearly follow up recommended.   PMH includes:  CAD (s/p CABG 2011), atrial fibrillation (post-CABG), HTN, prediabetes, DVT (post-op 2009), OSA, seizures. Never smoker. BMI 42  Medications include: ASA, carbamazepine, losartan, metoprolol, pravastatin.  Preoperative labs reviewed.  HbA1c 7.4, glucose 148.   EKG 04/10/16: Sinus rhythm with PACs.  Echo 03/19/10 (HPR): 1. Normal LV systolic function with mild concentric LVH. EF visually estimated at 65%. Borderline diastolic dysfunction. 2. There is a small pericardial effusion noted which is loculated and contains probable hematoma. 3. Large left pleural effusion is noted.  If no changes, I anticipate pt can proceed with surgery as scheduled.   Willeen Cass, FNP-BC [facility]/Anesthesiology Phone: [phone] 04/15/2016 11:54 AM

## 2016-04-16 DIAGNOSIS — G4733 Obstructive sleep apnea (adult) (pediatric): Secondary | ICD-10-CM | POA: Diagnosis not present

## 2016-04-20 MED ORDER — DEXTROSE 5 % IV SOLN
3.0000 g | INTRAVENOUS | Status: AC
  Administered 2016-04-21: 3 g via INTRAVENOUS
  Filled 2016-04-20 (×2): qty 3000

## 2016-04-21 ENCOUNTER — Inpatient Hospital Stay (HOSPITAL_COMMUNITY): Payer: Medicare HMO | Admitting: Anesthesiology

## 2016-04-21 ENCOUNTER — Encounter (HOSPITAL_COMMUNITY): Payer: Self-pay | Admitting: *Deleted

## 2016-04-21 ENCOUNTER — Inpatient Hospital Stay (HOSPITAL_COMMUNITY)
Admission: RE | Admit: 2016-04-21 | Discharge: 2016-04-22 | DRG: 470 | Disposition: A | Discharge status: Home Health | Payer: Medicare HMO | Source: Ambulatory Visit | Attending: Orthopedic Surgery | Admitting: Orthopedic Surgery

## 2016-04-21 ENCOUNTER — Inpatient Hospital Stay (HOSPITAL_COMMUNITY): Payer: Medicare HMO | Admitting: Emergency Medicine

## 2016-04-21 ENCOUNTER — Inpatient Hospital Stay (HOSPITAL_COMMUNITY): Payer: Medicare HMO

## 2016-04-21 ENCOUNTER — Encounter (HOSPITAL_COMMUNITY)
Admission: RE | Disposition: A | Discharge status: Home / Self Care | Payer: Self-pay | Source: Ambulatory Visit | Attending: Orthopedic Surgery

## 2016-04-21 DIAGNOSIS — Z96641 Presence of right artificial hip joint: Secondary | ICD-10-CM | POA: Diagnosis present

## 2016-04-21 DIAGNOSIS — Z79899 Other long term (current) drug therapy: Secondary | ICD-10-CM | POA: Diagnosis not present

## 2016-04-21 DIAGNOSIS — M1612 Unilateral primary osteoarthritis, left hip: Principal | ICD-10-CM

## 2016-04-21 DIAGNOSIS — Z9889 Other specified postprocedural states: Secondary | ICD-10-CM | POA: Diagnosis not present

## 2016-04-21 DIAGNOSIS — Z86718 Personal history of other venous thrombosis and embolism: Secondary | ICD-10-CM | POA: Diagnosis not present

## 2016-04-21 DIAGNOSIS — Z951 Presence of aortocoronary bypass graft: Secondary | ICD-10-CM

## 2016-04-21 DIAGNOSIS — G4733 Obstructive sleep apnea (adult) (pediatric): Secondary | ICD-10-CM | POA: Diagnosis not present

## 2016-04-21 DIAGNOSIS — I739 Peripheral vascular disease, unspecified: Secondary | ICD-10-CM | POA: Diagnosis not present

## 2016-04-21 DIAGNOSIS — Z9049 Acquired absence of other specified parts of digestive tract: Secondary | ICD-10-CM | POA: Diagnosis not present

## 2016-04-21 DIAGNOSIS — I2510 Atherosclerotic heart disease of native coronary artery without angina pectoris: Secondary | ICD-10-CM | POA: Diagnosis not present

## 2016-04-21 DIAGNOSIS — Z96649 Presence of unspecified artificial hip joint: Secondary | ICD-10-CM

## 2016-04-21 DIAGNOSIS — Z471 Aftercare following joint replacement surgery: Secondary | ICD-10-CM | POA: Diagnosis not present

## 2016-04-21 DIAGNOSIS — Z7982 Long term (current) use of aspirin: Secondary | ICD-10-CM | POA: Diagnosis not present

## 2016-04-21 DIAGNOSIS — M1610 Unilateral primary osteoarthritis, unspecified hip: Secondary | ICD-10-CM

## 2016-04-21 DIAGNOSIS — Z96642 Presence of left artificial hip joint: Secondary | ICD-10-CM | POA: Diagnosis not present

## 2016-04-21 DIAGNOSIS — I10 Essential (primary) hypertension: Secondary | ICD-10-CM | POA: Diagnosis present

## 2016-04-21 DIAGNOSIS — M1611 Unilateral primary osteoarthritis, right hip: Secondary | ICD-10-CM | POA: Diagnosis not present

## 2016-04-21 HISTORY — DX: Unilateral primary osteoarthritis, left hip: M16.12

## 2016-04-21 HISTORY — PX: TOTAL HIP ARTHROPLASTY: SHX124

## 2016-04-21 LAB — GLUCOSE, CAPILLARY
GLUCOSE-CAPILLARY: 184 mg/dL — AB (ref 65–99)
Glucose-Capillary: 168 mg/dL — ABNORMAL HIGH (ref 65–99)

## 2016-04-21 SURGERY — ARTHROPLASTY, HIP, TOTAL,POSTERIOR APPROACH
Anesthesia: General | Laterality: Left

## 2016-04-21 MED ORDER — METHOCARBAMOL 500 MG PO TABS
ORAL_TABLET | ORAL | Status: AC
  Filled 2016-04-21: qty 1

## 2016-04-21 MED ORDER — POTASSIUM CHLORIDE IN NACL 20-0.45 MEQ/L-% IV SOLN
INTRAVENOUS | Status: DC
  Administered 2016-04-21 – 2016-04-22 (×2): via INTRAVENOUS
  Filled 2016-04-21 (×2): qty 1000

## 2016-04-21 MED ORDER — FENTANYL CITRATE (PF) 100 MCG/2ML IJ SOLN
INTRAMUSCULAR | Status: AC
  Filled 2016-04-21: qty 2

## 2016-04-21 MED ORDER — LIDOCAINE 2% (20 MG/ML) 5 ML SYRINGE
INTRAMUSCULAR | Status: AC
  Filled 2016-04-21: qty 5

## 2016-04-21 MED ORDER — DEXAMETHASONE SODIUM PHOSPHATE 10 MG/ML IJ SOLN
10.0000 mg | Freq: Once | INTRAMUSCULAR | Status: AC
  Administered 2016-04-22: 10 mg via INTRAVENOUS
  Filled 2016-04-21: qty 1

## 2016-04-21 MED ORDER — SUGAMMADEX SODIUM 500 MG/5ML IV SOLN
INTRAVENOUS | Status: AC
  Filled 2016-04-21: qty 5

## 2016-04-21 MED ORDER — PRAVASTATIN SODIUM 40 MG PO TABS
40.0000 mg | ORAL_TABLET | Freq: Every day | ORAL | Status: DC
  Administered 2016-04-21: 40 mg via ORAL
  Filled 2016-04-21: qty 1

## 2016-04-21 MED ORDER — ENOXAPARIN SODIUM 40 MG/0.4ML ~~LOC~~ SOLN
40.0000 mg | SUBCUTANEOUS | Status: DC
  Administered 2016-04-22: 40 mg via SUBCUTANEOUS
  Filled 2016-04-21: qty 0.4

## 2016-04-21 MED ORDER — BUPIVACAINE HCL (PF) 0.25 % IJ SOLN
INTRAMUSCULAR | Status: DC | PRN
  Administered 2016-04-21: 20 mL

## 2016-04-21 MED ORDER — ZOLPIDEM TARTRATE 5 MG PO TABS: 5.0000 mg | ORAL_TABLET | Freq: Every evening | ORAL | Status: DC | PRN

## 2016-04-21 MED ORDER — MIDAZOLAM HCL 2 MG/2ML IJ SOLN
INTRAMUSCULAR | Status: AC
  Filled 2016-04-21: qty 2

## 2016-04-21 MED ORDER — LIDOCAINE 2% (20 MG/ML) 5 ML SYRINGE
INTRAMUSCULAR | Status: DC | PRN
  Administered 2016-04-21: 50 mg via INTRAVENOUS

## 2016-04-21 MED ORDER — HYDROMORPHONE HCL 1 MG/ML IJ SOLN
INTRAMUSCULAR | Status: AC
  Filled 2016-04-21: qty 0.5

## 2016-04-21 MED ORDER — POLYETHYLENE GLYCOL 3350 17 G PO PACK: 17.0000 g | PACK | Freq: Every day | ORAL | Status: DC | PRN

## 2016-04-21 MED ORDER — EPHEDRINE SULFATE-NACL 50-0.9 MG/10ML-% IV SOSY
PREFILLED_SYRINGE | INTRAVENOUS | Status: DC | PRN
  Administered 2016-04-21: 5 mg via INTRAVENOUS
  Administered 2016-04-21: 10 mg via INTRAVENOUS
  Administered 2016-04-21: 5 mg via INTRAVENOUS

## 2016-04-21 MED ORDER — PHENOL 1.4 % MT LIQD: 1.0000 | OROMUCOSAL | Status: DC | PRN

## 2016-04-21 MED ORDER — SENNA-DOCUSATE SODIUM 8.6-50 MG PO TABS: 2.0000 | ORAL_TABLET | Freq: Every day | ORAL | 1 refills | Status: AC

## 2016-04-21 MED ORDER — PROPOFOL 1000 MG/100ML IV EMUL
INTRAVENOUS | Status: AC
  Filled 2016-04-21: qty 300

## 2016-04-21 MED ORDER — METOCLOPRAMIDE HCL 5 MG PO TABS: 5.0000 mg | ORAL_TABLET | Freq: Three times a day (TID) | ORAL | Status: DC | PRN

## 2016-04-21 MED ORDER — OXYCODONE HCL 5 MG PO TABS
5.0000 mg | ORAL_TABLET | ORAL | Status: DC | PRN
  Administered 2016-04-21 (×2): 10 mg via ORAL
  Administered 2016-04-21: 5 mg via ORAL
  Administered 2016-04-22: 10 mg via ORAL
  Filled 2016-04-21 (×3): qty 2

## 2016-04-21 MED ORDER — CEFAZOLIN SODIUM-DEXTROSE 2-4 GM/100ML-% IV SOLN
2.0000 g | Freq: Four times a day (QID) | INTRAVENOUS | Status: AC
  Administered 2016-04-21 (×2): 2 g via INTRAVENOUS
  Filled 2016-04-21 (×2): qty 100

## 2016-04-21 MED ORDER — ONDANSETRON HCL 4 MG PO TABS: 4.0000 mg | ORAL_TABLET | Freq: Three times a day (TID) | ORAL | 0 refills | Status: AC | PRN

## 2016-04-21 MED ORDER — LOSARTAN POTASSIUM 50 MG PO TABS
100.0000 mg | ORAL_TABLET | Freq: Every day | ORAL | Status: DC
  Administered 2016-04-21: 100 mg via ORAL
  Filled 2016-04-21: qty 2

## 2016-04-21 MED ORDER — SUGAMMADEX SODIUM 200 MG/2ML IV SOLN
INTRAVENOUS | Status: DC | PRN
  Administered 2016-04-21: 300 mg via INTRAVENOUS

## 2016-04-21 MED ORDER — BACLOFEN 10 MG PO TABS: 10.0000 mg | ORAL_TABLET | Freq: Three times a day (TID) | ORAL | 0 refills | Status: AC

## 2016-04-21 MED ORDER — RIVAROXABAN 10 MG PO TABS: 10.0000 mg | ORAL_TABLET | Freq: Every day | ORAL | 0 refills | Status: DC

## 2016-04-21 MED ORDER — CARBAMAZEPINE 200 MG PO TABS
200.0000 mg | ORAL_TABLET | Freq: Every day | ORAL | Status: DC
  Administered 2016-04-21: 200 mg via ORAL
  Filled 2016-04-21: qty 1

## 2016-04-21 MED ORDER — ONDANSETRON HCL 4 MG PO TABS: 4.0000 mg | ORAL_TABLET | Freq: Four times a day (QID) | ORAL | Status: DC | PRN

## 2016-04-21 MED ORDER — ACETAMINOPHEN 650 MG RE SUPP: 650.0000 mg | Freq: Four times a day (QID) | RECTAL | Status: DC | PRN

## 2016-04-21 MED ORDER — ONDANSETRON HCL 4 MG/2ML IJ SOLN: 4.0000 mg | Freq: Once | INTRAMUSCULAR | Status: DC | PRN

## 2016-04-21 MED ORDER — RIVAROXABAN 10 MG PO TABS: 10.0000 mg | ORAL_TABLET | Freq: Every day | ORAL | Status: DC

## 2016-04-21 MED ORDER — ROCURONIUM BROMIDE 10 MG/ML (PF) SYRINGE
PREFILLED_SYRINGE | INTRAVENOUS | Status: DC | PRN
  Administered 2016-04-21: 40 mg via INTRAVENOUS
  Administered 2016-04-21 (×2): 10 mg via INTRAVENOUS

## 2016-04-21 MED ORDER — LACTATED RINGERS IV SOLN
INTRAVENOUS | Status: DC | PRN
  Administered 2016-04-21 (×2): via INTRAVENOUS

## 2016-04-21 MED ORDER — EPHEDRINE 5 MG/ML INJ
INTRAVENOUS | Status: AC
  Filled 2016-04-21: qty 10

## 2016-04-21 MED ORDER — SENNA 8.6 MG PO TABS
1.0000 | ORAL_TABLET | Freq: Two times a day (BID) | ORAL | Status: DC
  Administered 2016-04-21 – 2016-04-22 (×2): 8.6 mg via ORAL
  Filled 2016-04-21 (×3): qty 1

## 2016-04-21 MED ORDER — PROPOFOL 10 MG/ML IV BOLUS
INTRAVENOUS | Status: AC
  Filled 2016-04-21: qty 20

## 2016-04-21 MED ORDER — SUGAMMADEX SODIUM 200 MG/2ML IV SOLN: INTRAVENOUS | Status: DC | PRN

## 2016-04-21 MED ORDER — ROCURONIUM BROMIDE 50 MG/5ML IV SOSY
PREFILLED_SYRINGE | INTRAVENOUS | Status: AC
  Filled 2016-04-21: qty 5

## 2016-04-21 MED ORDER — SUCCINYLCHOLINE CHLORIDE 200 MG/10ML IV SOSY
PREFILLED_SYRINGE | INTRAVENOUS | Status: DC | PRN
  Administered 2016-04-21: 200 mg via INTRAVENOUS

## 2016-04-21 MED ORDER — DIPHENHYDRAMINE HCL 12.5 MG/5ML PO ELIX: 12.5000 mg | ORAL_SOLUTION | ORAL | Status: DC | PRN

## 2016-04-21 MED ORDER — 0.9 % SODIUM CHLORIDE (POUR BTL) OPTIME
TOPICAL | Status: DC | PRN
  Administered 2016-04-21: 1000 mL

## 2016-04-21 MED ORDER — ENOXAPARIN SODIUM 40 MG/0.4ML ~~LOC~~ SOLN: 40.0000 mg | SUBCUTANEOUS | 0 refills | Status: DC

## 2016-04-21 MED ORDER — HYDROMORPHONE HCL 2 MG/ML IJ SOLN
0.5000 mg | INTRAMUSCULAR | Status: DC | PRN
  Administered 2016-04-22: 0.5 mg via INTRAVENOUS
  Filled 2016-04-21: qty 1

## 2016-04-21 MED ORDER — PROPOFOL 10 MG/ML IV BOLUS
INTRAVENOUS | Status: DC | PRN
  Administered 2016-04-21: 190 mg via INTRAVENOUS

## 2016-04-21 MED ORDER — METOPROLOL SUCCINATE ER 50 MG PO TB24
50.0000 mg | ORAL_TABLET | Freq: Every day | ORAL | Status: DC
  Administered 2016-04-21: 50 mg via ORAL
  Filled 2016-04-21: qty 1

## 2016-04-21 MED ORDER — FENTANYL CITRATE (PF) 100 MCG/2ML IJ SOLN
INTRAMUSCULAR | Status: DC | PRN
Start: 2016-04-21 — End: 2016-04-21
  Administered 2016-04-21: 100 ug via INTRAVENOUS
  Administered 2016-04-21 (×2): 50 ug via INTRAVENOUS

## 2016-04-21 MED ORDER — ACETAMINOPHEN 325 MG PO TABS: 650.0000 mg | ORAL_TABLET | Freq: Four times a day (QID) | ORAL | Status: DC | PRN

## 2016-04-21 MED ORDER — METHOCARBAMOL 500 MG PO TABS
500.0000 mg | ORAL_TABLET | Freq: Four times a day (QID) | ORAL | Status: DC | PRN
  Administered 2016-04-21 – 2016-04-22 (×2): 500 mg via ORAL
  Filled 2016-04-21 (×2): qty 1

## 2016-04-21 MED ORDER — METHOCARBAMOL 1000 MG/10ML IJ SOLN
500.0000 mg | Freq: Four times a day (QID) | INTRAMUSCULAR | Status: DC | PRN
  Filled 2016-04-21: qty 5

## 2016-04-21 MED ORDER — MAGNESIUM CITRATE PO SOLN: 1.0000 | Freq: Once | ORAL | Status: DC | PRN

## 2016-04-21 MED ORDER — DOCUSATE SODIUM 100 MG PO CAPS
100.0000 mg | ORAL_CAPSULE | Freq: Two times a day (BID) | ORAL | Status: DC
  Administered 2016-04-21 – 2016-04-22 (×2): 100 mg via ORAL
  Filled 2016-04-21 (×3): qty 1

## 2016-04-21 MED ORDER — BUPIVACAINE HCL (PF) 0.25 % IJ SOLN
INTRAMUSCULAR | Status: AC
  Filled 2016-04-21: qty 30

## 2016-04-21 MED ORDER — HYDROMORPHONE HCL 1 MG/ML IJ SOLN
0.2500 mg | INTRAMUSCULAR | Status: DC | PRN
  Administered 2016-04-21 (×2): 0.5 mg via INTRAVENOUS

## 2016-04-21 MED ORDER — METOCLOPRAMIDE HCL 5 MG/ML IJ SOLN: 5.0000 mg | Freq: Three times a day (TID) | INTRAMUSCULAR | Status: DC | PRN

## 2016-04-21 MED ORDER — ALUM & MAG HYDROXIDE-SIMETH 200-200-20 MG/5ML PO SUSP
30.0000 mL | ORAL | Status: DC | PRN
Start: 2016-04-21 — End: 2016-04-22

## 2016-04-21 MED ORDER — SUCCINYLCHOLINE CHLORIDE 200 MG/10ML IV SOSY
PREFILLED_SYRINGE | INTRAVENOUS | Status: AC
  Filled 2016-04-21: qty 10

## 2016-04-21 MED ORDER — OXYCODONE HCL 5 MG PO TABS
ORAL_TABLET | ORAL | Status: AC
  Filled 2016-04-21: qty 1

## 2016-04-21 MED ORDER — ONDANSETRON HCL 4 MG/2ML IJ SOLN: 4.0000 mg | Freq: Four times a day (QID) | INTRAMUSCULAR | Status: DC | PRN

## 2016-04-21 MED ORDER — MENTHOL 3 MG MT LOZG: 1.0000 | LOZENGE | OROMUCOSAL | Status: DC | PRN

## 2016-04-21 MED ORDER — OXYCODONE-ACETAMINOPHEN 10-325 MG PO TABS: 1.0000 | ORAL_TABLET | Freq: Four times a day (QID) | ORAL | 0 refills | Status: AC | PRN

## 2016-04-21 MED ORDER — BISACODYL 10 MG RE SUPP: 10.0000 mg | Freq: Every day | RECTAL | Status: DC | PRN

## 2016-04-21 SURGICAL SUPPLY — 50 items
BIT DRILL 5/64X5 DISP (BIT) ×2 IMPLANT
BLADE SAW SAG 73X25 THK (BLADE) ×1
BLADE SAW SGTL 73X25 THK (BLADE) ×1 IMPLANT
BNDG COHESIVE 4X5 TAN STRL (GAUZE/BANDAGES/DRESSINGS) ×2 IMPLANT
CAPT HIP TOTAL 2 ×2 IMPLANT
CLSR STERI-STRIP ANTIMIC 1/2X4 (GAUZE/BANDAGES/DRESSINGS) ×4 IMPLANT
COVER SURGICAL LIGHT HANDLE (MISCELLANEOUS) ×2 IMPLANT
DRAPE INCISE IOBAN 66X45 STRL (DRAPES) IMPLANT
DRAPE ORTHO SPLIT 77X108 STRL (DRAPES) ×2
DRAPE SURG ORHT 6 SPLT 77X108 (DRAPES) ×2 IMPLANT
DRAPE U-SHAPE 47X51 STRL (DRAPES) ×2 IMPLANT
DRSG MEPILEX BORDER 4X12 (GAUZE/BANDAGES/DRESSINGS) IMPLANT
DRSG MEPILEX BORDER 4X8 (GAUZE/BANDAGES/DRESSINGS) ×2 IMPLANT
DURAPREP 26ML APPLICATOR (WOUND CARE) ×2 IMPLANT
ELECT CAUTERY BLADE 6.4 (BLADE) ×2 IMPLANT
ELECT REM PT RETURN 9FT ADLT (ELECTROSURGICAL) ×2
ELECTRODE REM PT RTRN 9FT ADLT (ELECTROSURGICAL) ×1 IMPLANT
GLOVE BIOGEL PI ORTHO PRO SZ8 (GLOVE) ×2
GLOVE ORTHO TXT STRL SZ7.5 (GLOVE) ×2 IMPLANT
GLOVE PI ORTHO PRO STRL SZ8 (GLOVE) ×2 IMPLANT
GLOVE SURG ORTHO 8.0 STRL STRW (GLOVE) ×2 IMPLANT
GOWN STRL REUS W/ TWL XL LVL3 (GOWN DISPOSABLE) ×3 IMPLANT
GOWN STRL REUS W/TWL 2XL LVL3 (GOWN DISPOSABLE) ×2 IMPLANT
GOWN STRL REUS W/TWL XL LVL3 (GOWN DISPOSABLE) ×3
HOOD PEEL AWAY FACE SHEILD DIS (HOOD) ×6 IMPLANT
KIT BASIN OR (CUSTOM PROCEDURE TRAY) ×2 IMPLANT
KIT ROOM TURNOVER OR (KITS) ×2 IMPLANT
MANIFOLD NEPTUNE II (INSTRUMENTS) ×2 IMPLANT
NDL SAFETY ECLIPSE 18X1.5 (NEEDLE) ×1 IMPLANT
NEEDLE HYPO 18GX1.5 SHARP (NEEDLE) ×1
NS IRRIG 1000ML POUR BTL (IV SOLUTION) ×2 IMPLANT
PACK TOTAL JOINT (CUSTOM PROCEDURE TRAY) ×2 IMPLANT
PAD ARMBOARD 7.5X6 YLW CONV (MISCELLANEOUS) ×4 IMPLANT
PILLOW ABDUCTION HIP (SOFTGOODS) ×2 IMPLANT
PRESSURIZER FEMORAL UNIV (MISCELLANEOUS) IMPLANT
RETRIEVER SUT HEWSON (MISCELLANEOUS) ×2 IMPLANT
SUCTION FRAZIER HANDLE 10FR (MISCELLANEOUS) ×1
SUCTION TUBE FRAZIER 10FR DISP (MISCELLANEOUS) ×1 IMPLANT
SUT FIBERWIRE #2 38 REV NDL BL (SUTURE) ×6
SUT VIC AB 0 CT1 27 (SUTURE) ×1
SUT VIC AB 0 CT1 27XBRD ANBCTR (SUTURE) ×1 IMPLANT
SUT VIC AB 2-0 CT1 27 (SUTURE) ×1
SUT VIC AB 2-0 CT1 TAPERPNT 27 (SUTURE) ×1 IMPLANT
SUT VIC AB 3-0 SH 8-18 (SUTURE) ×2 IMPLANT
SUTURE FIBERWR#2 38 REV NDL BL (SUTURE) ×3 IMPLANT
SYR CONTROL 10ML LL (SYRINGE) ×2 IMPLANT
TOWEL OR 17X24 6PK STRL BLUE (TOWEL DISPOSABLE) ×2 IMPLANT
TOWEL OR 17X26 10 PK STRL BLUE (TOWEL DISPOSABLE) ×2 IMPLANT
TRAY CATH 16FR W/PLASTIC CATH (SET/KITS/TRAYS/PACK) IMPLANT
TRAY FOLEY CATH 14FR (SET/KITS/TRAYS/PACK) ×2 IMPLANT

## 2016-04-21 NOTE — Anesthesia Preprocedure Evaluation (Signed)
Anesthesia Evaluation  Patient identified by MRN, date of birth, ID band Patient awake    Reviewed: Allergy & Precautions, NPO status , Patient's Chart, lab work & pertinent test results  Airway Mallampati: III  TM Distance: >3 FB Neck ROM: Full    Dental  (+) Teeth Intact, Dental Advisory Given   Pulmonary    breath sounds clear to auscultation       Cardiovascular hypertension,  Rhythm:Regular     Neuro/Psych    GI/Hepatic   Endo/Other    Renal/GU      Musculoskeletal   Abdominal (+) + obese,   Peds  Hematology   Anesthesia Other Findings   Reproductive/Obstetrics                             Anesthesia Physical Anesthesia Plan  ASA: III  Anesthesia Plan: General   Post-op Pain Management:    Induction: Intravenous  Airway Management Planned: Oral ETT and Video Laryngoscope Planned  Additional Equipment:   Intra-op Plan:   Post-operative Plan: Extubation in OR  Informed Consent: I have reviewed the patients History and Physical, chart, labs and discussed the procedure including the risks, benefits and alternatives for the proposed anesthesia with the patient or authorized representative who has indicated his/her understanding and acceptance.   Dental advisory given  Plan Discussed with: CRNA and Anesthesiologist  Anesthesia Plan Comments: (CAD S/P CABG in 2011, clinically stable, normal LV function Sleep apnea on CPAP Morbid obesity Type 2 DM glucose 168 Hypertension  Plan GA with oral ETT and glide scope intubation)        Anesthesia Quick Evaluation

## 2016-04-21 NOTE — Anesthesia Procedure Notes (Signed)
Procedure Name: Intubation Date/Time: 04/21/2016 7:35 AM Performed by: Myna Bright Pre-anesthesia Checklist: Patient identified, Emergency Drugs available, Suction available and Patient being monitored Patient Re-evaluated:Patient Re-evaluated prior to inductionOxygen Delivery Method: Circle system utilized Preoxygenation: Pre-oxygenation with 100% oxygen Intubation Type: IV induction Ventilation: Mask ventilation without difficulty and Oral airway inserted - appropriate to patient size Laryngoscope Size: Glidescope and 3 Tube type: Oral Tube size: 7.5 mm Number of attempts: 1 Airway Equipment and Method: Stylet and Video-laryngoscopy Placement Confirmation: ETT inserted through vocal cords under direct vision,  positive ETCO2 and breath sounds checked- equal and bilateral Secured at: 22 cm Tube secured with: Tape Dental Injury: Teeth and Oropharynx as per pre-operative assessment  Difficulty Due To: Difficulty was anticipated Comments: Easy mask airway with oral airway in place. DL with Glidescope x1, Grade I view with Glide, atraumatic oral intubation. Glidescope used d/t anticipated difficult airway.

## 2016-04-21 NOTE — Evaluation (Signed)
Physical Therapy Evaluation Patient Details Name: Edward Rojas MRN: [id number] DOB: [birth year] Today's Date: 04/21/2016   History of Present Illness  71 y.o. male s/p Lt posterior THA. PMH: Rt THA, CABG, seizures, HTN.   Clinical Impression  Pt is s/p Lt posterior THA resulting in the deficits listed below (see PT Problem List). Pt requiring mod assist with initial bed mobility and able to ambulate 7 ft with rw. Reminders needed to maintain hip precautions.  Pt will benefit from skilled PT to increase their independence and safety with mobility. Anticipate pt will D/C to home with family support.      Follow Up Recommendations Home health PT;Supervision for mobility/OOB    Equipment Recommendations  None recommended by PT    Recommendations for Other Services       Precautions / Restrictions Precautions Precautions: Posterior Hip Precaution Booklet Issued: Yes (comment) Precaution Comments: HEP provided, reviewed and posted posterior hip precautions Restrictions Weight Bearing Restrictions: Yes LLE Weight Bearing: Weight bearing as tolerated      Mobility  Bed Mobility Overal bed mobility: Needs Assistance Bed Mobility: Supine to Sit     Supine to sit: Mod assist     General bed mobility comments: assist with LLE to move edge of bed and assist at trunk to come to sitting. Reminder for precautions as needed.   Transfers Overall transfer level: Needs assistance Equipment used: Rolling walker (2 wheeled) Transfers: Sit to/from Stand Sit to Stand: Min assist         General transfer comment: cues need for hip precautions  Ambulation/Gait Ambulation/Gait assistance: Min guard Ambulation Distance (Feet): 7 Feet Assistive device: Rolling walker (2 wheeled) Gait Pattern/deviations: Step-to pattern;Decreased stance time - left;Decreased weight shift to left Gait velocity: decreased   General Gait Details: good stability with ambulation using rw.   Stairs             Wheelchair Mobility    Modified Rankin (Stroke Patients Only)       Balance Overall balance assessment: Needs assistance Sitting-balance support: No upper extremity supported Sitting balance-Leahy Scale: Fair     Standing balance support: Bilateral upper extremity supported Standing balance-Leahy Scale: Poor Standing balance comment: using rw for support                             Pertinent Vitals/Pain Pain Assessment: 0-10 Pain Score: 3  Pain Location: Lt hip Pain Descriptors / Indicators:  (muscle pull) Pain Intervention(s): Limited activity within patient's tolerance;Monitored during session    Home Living Family/patient expects to be discharged to:: Private residence Living Arrangements: Spouse/significant other Available Help at Discharge: Family;Available 24 hours/day (wife and daughter able to assist) Type of Home: House Home Access: Stairs to enter Entrance Stairs-Rails: Psychiatric nurse of Steps: 3 Home Layout: One level Home Equipment: Walker - 2 wheels;Cane - quad;Bedside commode      Prior Function Level of Independence: Independent               Hand Dominance        Extremity/Trunk Assessment   Upper Extremity Assessment Upper Extremity Assessment: Overall WFL for tasks assessed    Lower Extremity Assessment Lower Extremity Assessment: LLE deficits/detail LLE Deficits / Details: assist needed to move LLE for bed mobility.        Communication   Communication: No difficulties  Cognition Arousal/Alertness: Awake/alert Behavior During Therapy: WFL for tasks assessed/performed Overall Cognitive Status: Within Functional Limits for  tasks assessed                      General Comments General comments (skin integrity, edema, etc.): SpO2 95% on RA after session, nursing notified    Exercises     Assessment/Plan    PT Assessment Patient needs continued PT services  PT Problem List  Decreased strength;Decreased range of motion;Decreased activity tolerance;Decreased balance;Decreased mobility;Decreased safety awareness;Decreased knowledge of precautions          PT Treatment Interventions DME instruction;Gait training;Stair training;Functional mobility training;Therapeutic activities;Therapeutic exercise;Balance training;Patient/family education    PT Goals (Current goals can be found in the Care Plan section)  Acute Rehab PT Goals Patient Stated Goal: be able to get back to exercise program PT Goal Formulation: With patient Time For Goal Achievement: 05/05/16 Potential to Achieve Goals: Good    Frequency 7X/week   Barriers to discharge        Co-evaluation               End of Session Equipment Utilized During Treatment: Gait belt Activity Tolerance: Patient tolerated treatment well Patient left: in chair;with call bell/phone within reach Nurse Communication: Mobility status         Time: [id number] PT Time Calculation (min) (ACUTE ONLY): 32 min   Charges:   PT Evaluation $PT Eval Moderate Complexity: 1 Procedure PT Treatments $Therapeutic Activity: 8-22 mins   PT G Codes:        Cassell Clement, PT, CSCS Pager [phone] Office [phone]  04/21/2016, 3:43 PM

## 2016-04-21 NOTE — Transfer of Care (Signed)
Immediate Anesthesia Transfer of Care Note  Patient: Edward Rojas  Procedure(s) Performed: Procedure(s): TOTAL HIP ARTHROPLASTY (Left)  Patient Location: PACU  Anesthesia Type:General  Level of Consciousness: awake, alert , oriented and patient cooperative  Airway & Oxygen Therapy: Patient Spontanous Breathing and Patient connected to face mask oxygen  Post-op Assessment: Report given to RN, Post -op Vital signs reviewed and stable and Patient moving all extremities  Post vital signs: Reviewed and stable  Last Vitals:  Vitals:   04/21/16 0636 04/21/16 1004  BP: (!) 187/71 (!) 179/81  Pulse: 68 84  Resp: 20 19  Temp: 36.8 C 36.3 C    Last Pain:  Vitals:   04/21/16 0636  TempSrc: Oral      Patients Stated Pain Goal: 7 ([id number])  Complications: No apparent anesthesia complications

## 2016-04-21 NOTE — Op Note (Signed)
04/21/2016  9:39 AM  PATIENT:  Edward Rojas   MRN: [id number]  PRE-OPERATIVE DIAGNOSIS:  Primary localized osteoarthritis of left hip  POST-OPERATIVE DIAGNOSIS:  Primary localized osteoarthritis of left hip  PROCEDURE:  Procedure(s): TOTAL HIP ARTHROPLASTY  PREOPERATIVE INDICATIONS:    Alecxander Mainwaring is an 71 y.o. male who has a diagnosis of Primary localized osteoarthritis of left hip and elected for surgical management after failing conservative treatment.  The risks benefits and alternatives were discussed with the patient including but not limited to the risks of nonoperative treatment, versus surgical intervention including infection, bleeding, nerve injury, periprosthetic fracture, the need for revision surgery, dislocation, leg length discrepancy, blood clots, cardiopulmonary complications, morbidity, mortality, among others, and they were willing to proceed.     OPERATIVE REPORT     SURGEON:  Marchia Bond, MD    ASSISTANT:  Joya Gaskins, OPA-C  (Present throughout the entire procedure,  necessary for completion of procedure in a timely manner, assisting with retraction, instrumentation, and closure)     ANESTHESIA:  General with a foley  EBL 578    COMPLICATIONS:  None.     UNIQUE ASPECTS OF THE CASE:  The hip was extremely stiff, and he had a fair amount of "pistol grip" deformity with varus neck, which is why I used a high offset.  There was remarkable severe end-stage sclerosis of the superior dome of the femoral head. Moderate posterior acetabular osteophyte formation. I initially trialed with a neutral liner, although he was unstable at about 40 of internal rotation, I went to a posterior lipped liner, and then after I had the femoral component in, the +1.5 was still a little bit short and had a significant amount of shuck, so I went with the +5. Soft tissue did reach the bone on capsular closure.  COMPONENTS:  [employer] fit femur size 6 with a 36 mm + 5 head ball  and a gription acetabular shell size 58 with an apex hole eliminator and a 10 degree +4 lipped polyethylene liner.    PROCEDURE IN DETAIL:   The patient was met in the holding area and  identified.  The appropriate hip was identified and marked at the operative site.  The patient was then transported to the OR  and  placed under anesthesia.  At that point, the patient was  placed in the lateral decubitus position with the operative side up and  secured to the operating room table and all bony prominences padded.     The operative lower extremity was prepped from the iliac crest to the distal leg.  Sterile draping was performed.  Time out was performed prior to incision.      A routine posterolateral approach was utilized via sharp dissection  carried down to the subcutaneous tissue.  Gross bleeders were Bovie coagulated.  The iliotibial band was identified and incised along the length of the skin incision.  Self-retaining retractors were  inserted.  With the hip internally rotated, the short external rotators  were identified. The piriformis and capsule was tagged with FiberWire, and the hip capsule released in a T-type fashion.  The femoral neck was exposed, and I resected the femoral neck using the appropriate jig. This was performed at approximately a just less than a thumb's breadth above the lesser trochanter.    I then exposed the deep acetabulum, cleared out any tissue including the ligamentum teres.  A wing retractor was placed.  After adequate visualization, I excised the labrum, and  then sequentially reamed.  I placed the trial acetabulum, which seated nicely, and then impacted the real cup into place.  Appropriate version and inclination was confirmed clinically matching their bony anatomy, and also with the use of the jig.  A trial polyethylene liner was placed and the wing retractor removed. I initially trialed with a neutral liner     I then prepared the proximal femur using the  cookie-cutter, the lateralizing reamer, and then sequentially reamed and broached.  A trial broach, neck, and head was utilized, and I reduced the hip.  Initially I used a +1.5, but it was not stable enough, specifically in the posterior stability.  I placed the real liner, using a posterior lipped liner, after placing the hole eliminator.  I then placed the stem, and trialed with a +1.5, and it was still somewhat short and not quite stable enough, so I went to a +5, and placed the real head. It was found to have excellent stability with functional range of motion.   Leg lengths were restored.  I then used a 2 mm drill bits to pass the FiberWire suture from the capsule and piriformis through the greater trochanter, and secured this. Excellent posterior capsular repair was achieved. I also closed the T in the capsule.  I then irrigated the hip copiously again with pulse lavage, and repaired the fascia with Vicryl, followed by Vicryl for the subcutaneous tissue, Monocryl for the skin, Steri-Strips and sterile gauze. The wounds were injected. The patient was then awakened and returned to PACU in stable and satisfactory condition. There were no complications.  Marchia Bond, MD Orthopedic Surgeon [phone]   04/21/2016 9:39 AM

## 2016-04-21 NOTE — Discharge Instructions (Signed)

## 2016-04-21 NOTE — H&P (Addendum)
PREOPERATIVE H&P  Chief Complaint: djd left hip  HPI: Edward Rojas is a 71 y.o. male who presents for preoperative history and physical with a diagnosis of djd left hip. Symptoms are rated as moderate to severe, and have been worsening.  This is significantly impairing activities of daily living.  He has elected for surgical management.   He has failed injections, activity modification, anti-inflammatories, and assistive devices.  Preoperative X-rays demonstrate end stage degenerative changes with osteophyte formation, loss of joint space, subchondral sclerosis.   Past Medical History:  Diagnosis Date  . Arthritis   . Atrial fibrillation ([place])    post CABG 2011  . Coronary artery disease   . Hypertension   . OSA (obstructive sleep apnea)   . Peripheral vascular disease ([place]) 2009   after rt hip dvt  . Pre-diabetes   . Seizures ([place])    none since 81  . Sleep apnea    cpap-used 8 yrs   Past Surgical History:  Procedure Laterality Date  . CHOLECYSTECTOMY    . CORONARY ARTERY BYPASS GRAFT  02/2010  . EYE SURGERY Bilateral    cataracts  . JOINT REPLACEMENT Right 2009   rt hip  . TONSILLECTOMY     Social History   Social History  . Marital status: Married    Spouse name: N/A  . Number of children: N/A  . Years of education: N/A   Social History Main Topics  . Smoking status: Never Smoker  . Smokeless tobacco: Never Used  . Alcohol use No  . Drug use: No  . Sexual activity: Not Asked   Other Topics Concern  . None   Social History Narrative  . None   History reviewed. No pertinent family history. Allergies  Allergen Reactions  . No Known Allergies    Prior to Admission medications   Medication Sig Start Date End Date Taking? Authorizing Provider  aspirin EC 81 MG tablet Take 81 mg by mouth at bedtime.   Yes Historical Provider, MD  carbamazepine (TEGRETOL) 200 MG tablet Take 200 mg by mouth at bedtime.   Yes Historical Provider, MD  losartan (COZAAR) 100  MG tablet Take 100 mg by mouth at bedtime.   Yes Historical Provider, MD  metoprolol succinate (TOPROL-XL) 50 MG 24 hr tablet Take 50 mg by mouth at bedtime. 02/20/16  Yes Historical Provider, MD  naproxen sodium (ANAPROX) 220 MG tablet Take 220 mg by mouth at bedtime.   Yes Historical Provider, MD  pravastatin (PRAVACHOL) 40 MG tablet Take 40 mg by mouth at bedtime. 02/19/16  Yes Historical Provider, MD     Positive ROS: All other systems have been reviewed and were otherwise negative with the exception of those mentioned in the HPI and as above.  Physical Exam:  Estimated body mass index is 41.81 kg/m as calculated from the following:   Height as of this encounter: 5\' 8"  (1.727 m).   Weight as of this encounter: 124.7 kg (275 lb).  General: Alert, no acute distress Cardiovascular: No pedal edema Respiratory: No cyanosis, no use of accessory musculature GI: No organomegaly, abdomen is soft and non-tender Skin: No lesions in the area of chief complaint Neurologic: Sensation intact distally Psychiatric: Patient is competent for consent with normal mood and affect Lymphatic: No axillary or cervical lymphadenopathy  MUSCULOSKELETAL: left hip arom 0-80, IR to 5, severe pain with motion, EHL intact  Assessment: djd left hip, primary localized   Plan: Plan for Procedure(s): TOTAL HIP ARTHROPLASTY  The  risks benefits and alternatives were discussed with the patient including but not limited to the risks of nonoperative treatment, versus surgical intervention including infection, bleeding, nerve injury, periprosthetic fracture, the need for revision surgery, dislocation, leg length discrepancy, blood clots, cardiopulmonary complications, morbidity, mortality, among others, and they were willing to proceed.    Johnny Bridge, MD Cell [phone]   04/21/2016 7:20 AM

## 2016-04-21 NOTE — Anesthesia Postprocedure Evaluation (Addendum)
Anesthesia Post Note  Patient: Edward Rojas  Procedure(s) Performed: Procedure(s) (LRB): TOTAL HIP ARTHROPLASTY (Left)  Patient location during evaluation: PACU Anesthesia Type: General Level of consciousness: sedated Pain management: pain level controlled Vital Signs Assessment: post-procedure vital signs reviewed and stable Respiratory status: spontaneous breathing and respiratory function stable Cardiovascular status: stable Anesthetic complications: no       Last Vitals:  Vitals:   04/21/16 1052 04/21/16 1100  BP: (!) 161/70   Pulse: 64 86  Resp: 10 14  Temp:      Last Pain:  Vitals:   04/21/16 1052  TempSrc:   PainSc: Asleep                 [name]

## 2016-04-22 ENCOUNTER — Encounter (HOSPITAL_COMMUNITY): Payer: Self-pay | Admitting: Orthopedic Surgery

## 2016-04-22 LAB — CBC
HCT: 38.6 % — ABNORMAL LOW (ref 39.0–52.0)
Hemoglobin: 12.8 g/dL — ABNORMAL LOW (ref 13.0–17.0)
MCH: 30.5 pg (ref 26.0–34.0)
MCHC: 33.2 g/dL (ref 30.0–36.0)
MCV: 92.1 fL (ref 78.0–100.0)
Platelets: 227 10*3/uL (ref 150–400)
RBC: 4.19 MIL/uL — AB (ref 4.22–5.81)
RDW: 12.8 % (ref 11.5–15.5)
WBC: 12.5 10*3/uL — AB (ref 4.0–10.5)

## 2016-04-22 LAB — BASIC METABOLIC PANEL
Anion gap: 11 (ref 5–15)
BUN: 10 mg/dL (ref 6–20)
CALCIUM: 8.7 mg/dL — AB (ref 8.9–10.3)
CO2: 28 mmol/L (ref 22–32)
Chloride: 95 mmol/L — ABNORMAL LOW (ref 101–111)
Creatinine, Ser: 0.92 mg/dL (ref 0.61–1.24)
GLUCOSE: 199 mg/dL — AB (ref 65–99)
POTASSIUM: 4 mmol/L (ref 3.5–5.1)
SODIUM: 134 mmol/L — AB (ref 135–145)

## 2016-04-22 MED ORDER — ENOXAPARIN SODIUM 40 MG/0.4ML ~~LOC~~ SOLN: 40.0000 mg | SUBCUTANEOUS | 0 refills | Status: AC

## 2016-04-22 NOTE — Progress Notes (Signed)
Physical Therapy Treatment Patient Details Name: Edward Rojas MRN: [id number] DOB: 1945/08/06 Today's Date: 04/22/2016    History of Present Illness 71 y.o. male s/p Lt posterior THA. PMH: Rt THA, CABG, seizures, HTN.     PT Comments    Patient is progressing well toward mobility goals and is min guard/supervision overall. Pt able to increased gait distance and tolerated stair training and therex well. Wife present for session. Current plan remains appropriate.   Follow Up Recommendations  Home health PT;Supervision for mobility/OOB     Equipment Recommendations  None recommended by PT    Recommendations for Other Services       Precautions / Restrictions Precautions Precautions: Posterior Hip Precaution Booklet Issued: Yes (comment) Precaution Comments: HEP provided, reviewed and posted posterior hip precautions Restrictions Weight Bearing Restrictions: Yes LLE Weight Bearing: Weight bearing as tolerated    Mobility  Bed Mobility Overal bed mobility: Needs Assistance Bed Mobility: Supine to Sit     Supine to sit: Min assist     General bed mobility comments: assist to bring L LE to EOB; no bed rails and HOB flat; cues for sequencing/technique  Transfers Overall transfer level: Needs assistance Equipment used: Rolling walker (2 wheeled) Transfers: Sit to/from Stand Sit to Stand: Min guard         General transfer comment: cues for hand placement and precautions  Ambulation/Gait Ambulation/Gait assistance: Supervision Ambulation Distance (Feet): 140 Feet Assistive device: Rolling walker (2 wheeled) Gait Pattern/deviations: Decreased weight shift to left;Step-through pattern;Decreased stride length;Trunk flexed Gait velocity: decreased   General Gait Details: cues for posture and L heel strike   Stairs Stairs: Yes   Stair Management: One rail Left;Step to pattern;Sideways Number of Stairs: 3 General stair comments: cues for sequencing and  technique  Wheelchair Mobility    Modified Rankin (Stroke Patients Only)       Balance Overall balance assessment: Needs assistance Sitting-balance support: No upper extremity supported Sitting balance-Leahy Scale: Good       Standing balance-Leahy Scale: Fair Standing balance comment: able to static stand briefly without UE support                    Cognition Arousal/Alertness: Awake/alert Behavior During Therapy: WFL for tasks assessed/performed Overall Cognitive Status: Within Functional Limits for tasks assessed                      Exercises Total Joint Exercises Quad Sets: AROM;Both;10 reps Gluteal Sets: AROM;Both;10 reps Heel Slides: AAROM;Left;10 reps Hip ABduction/ADduction: AAROM;Left;10 reps Long Arc Quad: AROM;Left;10 reps    General Comments General comments (skin integrity, edema, etc.): wife present for session      Pertinent Vitals/Pain Pain Assessment: 0-10 Pain Score: 3  Pain Location: Lt hip Pain Descriptors / Indicators: Aching;Tightness Pain Intervention(s): Limited activity within patient's tolerance;Monitored during session;Repositioned    Home Living                      Prior Function            PT Goals (current goals can now be found in the care plan section) Acute Rehab PT Goals Patient Stated Goal: be able to get back to exercise program PT Goal Formulation: With patient Time For Goal Achievement: 05/05/16 Potential to Achieve Goals: Good Progress towards PT goals: Progressing toward goals    Frequency    7X/week      PT Plan Current plan remains appropriate  Co-evaluation             End of Session Equipment Utilized During Treatment: Gait belt Activity Tolerance: Patient tolerated treatment well Patient left: in chair;with call bell/phone within reach;with nursing/sitter in room;with family/visitor present     Time: 1000-1037 PT Time Calculation (min) (ACUTE ONLY): 37  min  Charges:  $Gait Training: 8-22 mins $Therapeutic Exercise: 8-22 mins                    G Codes:      Salina April, PTA Pager: [phone]   04/22/2016, 10:45 AM

## 2016-04-22 NOTE — Care Management Note (Signed)
Case Management Note  Patient Details  Name: Camry Faler MRN: [id number] Date of Birth: 05-24-1945  Subjective/Objective:    71 yr old gentleman s/p left total hip arthroplasty.                Action/Plan: Case manager spoke with patient and wife concerning [place] and DME needs. Patient was preoperatively setup with [facility], no changes. Patient has RW, and states he has elevated toilets so doesn't need 3in1. Patient will have family support at discharge.    Expected Discharge Date:  04/22/16               Expected Discharge Plan:  [place]  In-House Referral:  NA  Discharge planning Services  CM Consult  Post Acute Care Choice:  Home Health Choice offered to:  Patient  DME Arranged:  N/A DME Agency:  NA  HH Arranged:  PT [place] Agency:  [facility] (formerly [employer])  Status of Service:  Completed, signed off  If discussed at [employer], dates discussed:    Additional Comments:  Ninfa Meeker, RN 04/22/2016, 2:30 PM

## 2016-04-22 NOTE — Discharge Summary (Signed)
Physician Discharge Summary  Patient ID: Miko Lovos MRN: [id number] DOB/AGE: October 01, 1945 71 y.o.  Admit date: 04/21/2016 Discharge date: 04/22/2016  Admission Diagnoses:  Primary localized osteoarthritis of left hip  Discharge Diagnoses:  Principal Problem:   Primary localized osteoarthritis of left hip Active Problems:   S/P total hip arthroplasty   Past Medical History:  Diagnosis Date  . Arthritis   . Atrial fibrillation ([place])    post CABG 2011  . Coronary artery disease   . Hypertension   . OSA (obstructive sleep apnea)   . Peripheral vascular disease ([place]) 2009   after rt hip dvt  . Pre-diabetes   . Primary localized osteoarthritis of left hip 04/21/2016  . Seizures ([place])    none since 81  . Sleep apnea    cpap-used 8 yrs    Surgeries: Procedure(s): TOTAL HIP ARTHROPLASTY on 04/21/2016   Consultants (if any):   Discharged Condition: Improved  Hospital Course: Jeanne Gauntlett is an 71 y.o. male who was admitted 04/21/2016 with a diagnosis of Primary localized osteoarthritis of left hip and went to the operating room on 04/21/2016 and underwent the above named procedures.    He was given perioperative antibiotics:  Anti-infectives    Start     Dose/Rate Route Frequency Ordered Stop   04/21/16 1500  ceFAZolin (ANCEF) IVPB 2g/100 mL premix     2 g 200 mL/hr over 30 Minutes Intravenous Every 6 hours 04/21/16 1408 04/21/16 2221   04/21/16 0700  ceFAZolin (ANCEF) 3 g in dextrose 5 % 50 mL IVPB     3 g 130 mL/hr over 30 Minutes Intravenous On call to O.R. 04/20/16 1333 04/21/16 0745    .  He was given sequential compression devices, early ambulation, and xarelto initially but switched to lovenox for DVT prophylaxis given history of DVT.  He benefited maximally from the hospital stay and there were no complications.    Recent vital signs:  Vitals:   04/22/16 0037 04/22/16 0351  BP: (!) 175/75 (!) 157/75  Pulse: 89 79  Resp: 18 18  Temp: 99.1 F (37.3 C) 99.3 F (37.4  C)    Recent laboratory studies:  Lab Results  Component Value Date   HGB 12.8 (L) 04/22/2016   HGB 15.2 04/10/2016   HGB 11.9 (L) 06/04/2007   Lab Results  Component Value Date   WBC 12.5 (H) 04/22/2016   PLT 227 04/22/2016   Lab Results  Component Value Date   INR 1.7 (H) 06/04/2007   Lab Results  Component Value Date   NA 134 (L) 04/22/2016   K 4.0 04/22/2016   CL 95 (L) 04/22/2016   CO2 28 04/22/2016   BUN 10 04/22/2016   CREATININE 0.92 04/22/2016   GLUCOSE 199 (H) 04/22/2016    Discharge Medications:   Allergies as of 04/22/2016      Reactions   No Known Allergies       Medication List    STOP taking these medications   aspirin EC 81 MG tablet   naproxen sodium 220 MG tablet Commonly known as:  ANAPROX     TAKE these medications   baclofen 10 MG tablet Commonly known as:  LIORESAL Take 1 tablet (10 mg total) by mouth 3 (three) times daily. As needed for muscle spasm   carbamazepine 200 MG tablet Commonly known as:  TEGRETOL Take 200 mg by mouth at bedtime.   enoxaparin 40 MG/0.4ML injection Commonly known as:  LOVENOX Inject 0.4 mLs (40 mg total)  into the skin daily.   losartan 100 MG tablet Commonly known as:  COZAAR Take 100 mg by mouth at bedtime.   metoprolol succinate 50 MG 24 hr tablet Commonly known as:  TOPROL-XL Take 50 mg by mouth at bedtime.   ondansetron 4 MG tablet Commonly known as:  ZOFRAN Take 1 tablet (4 mg total) by mouth every 8 (eight) hours as needed for nausea or vomiting.   oxyCODONE-acetaminophen 10-325 MG tablet Commonly known as:  PERCOCET Take 1-2 tablets by mouth every 6 (six) hours as needed for pain. MAXIMUM TOTAL ACETAMINOPHEN DOSE IS 4000 MG PER DAY   pravastatin 40 MG tablet Commonly known as:  PRAVACHOL Take 40 mg by mouth at bedtime.   sennosides-docusate sodium 8.6-50 MG tablet Commonly known as:  SENOKOT-S Take 2 tablets by mouth daily.            Durable Medical Equipment        Start      Ordered   04/21/16 1409  DME Walker rolling  Once    Question:  Patient needs a walker to treat with the following condition  Answer:  Primary localized osteoarthritis of left hip   04/21/16 1408   04/21/16 1409  DME 3 n 1  Once     04/21/16 1408   04/21/16 1409  DME Bedside commode  Once    Question:  Patient needs a bedside commode to treat with the following condition  Answer:  Primary localized osteoarthritis of left hip   04/21/16 1408      Diagnostic Studies: Dg Hip Port Unilat With Pelvis 1v Left  Result Date: 04/21/2016 CLINICAL DATA:  Less post left total hip arthroplasty. EXAM: DG HIP (WITH OR WITHOUT PELVIS) 1V PORT LEFT COMPARISON:  AP pelvis of May 30, 2007. No more recent studies are available. FINDINGS: There is a pre-existing right total hip joint prosthesis. On the left a prosthesis is present. Radiographic positioning of the prosthetic components is good. The interface with the native bone appears normal. No acute native bone abnormality is observed. IMPRESSION: No immediate postprocedure complication following left total hip arthroplasty. Electronically Signed   By: David  Martinique M.D.   On: 04/21/2016 11:23    Disposition:     Follow-up Information    HANDY,MICHAEL H, MD. Schedule an appointment as soon as possible for a visit in 2 weeks.   Specialty:  Orthopedic Surgery Contact information: [place] [address] [phone]            Signed: Johnny Bridge 04/22/2016, 8:00 AM

## 2016-04-22 NOTE — Evaluation (Signed)
Occupational Therapy Evaluation Patient Details Name: Edward Rojas MRN: [id number] DOB: Oct 19, 1945 Today's Date: 04/22/2016    History of Present Illness 71 y.o. male s/p Lt posterior THA. PMH: Rt THA, CABG, seizures, HTN.    Clinical Impression   Pt at mod A level with LB ADLs and mi guard A with ADL mobility using RW. Pt will have assist form wife at home and all necessary DME and A/E. All education completed and no further acute OT indicated at this time    Follow Up Recommendations  No OT follow up;Supervision - Intermittent    Equipment Recommendations  Toilet rise with handles;Tub/shower seat;Other (comment) Management consultant)    Recommendations for Other Services       Precautions / Restrictions Precautions Precautions: Posterior Hip Precaution Booklet Issued: Yes (comment) Precaution Comments: reviewed all hip precautions with pt and wife Restrictions Weight Bearing Restrictions: Yes LLE Weight Bearing: Weight bearing as tolerated      Mobility Bed Mobility Overal bed mobility: Needs Assistance Bed Mobility: Supine to Sit     Supine to sit: Min assist     General bed mobility comments: pt up in recliner  Transfers Overall transfer level: Needs assistance Equipment used: Rolling walker (2 wheeled) Transfers: Sit to/from Stand Sit to Stand: Min guard         General transfer comment: cues for hand placement and precautions    Balance Overall balance assessment: Needs assistance Sitting-balance support: No upper extremity supported Sitting balance-Leahy Scale: Good     Standing balance support: Bilateral upper extremity supported Standing balance-Leahy Scale: Fair Standing balance comment: able to static stand briefly without UE support                            ADL Overall ADL's : Needs assistance/impaired     Grooming: Wash/dry hands;Wash/dry face;Standing;Supervision/safety;Set up   Upper Body Bathing: Set up;Sitting   Lower Body  Bathing: Moderate assistance;With caregiver independent assisting   Upper Body Dressing : Sitting;Set up   Lower Body Dressing: Moderate assistance;With caregiver independent assisting   Toilet Transfer: Min guard;Ambulation;RW;Comfort height toilet   Toileting- Clothing Manipulation and Hygiene: Minimal assistance;Sit to/from stand   Tub/ Shower Transfer: 3 in 1;Min guard;With caregiver independent assisting;Rolling walker;Ambulation   Functional mobility during ADLs: Min guard;Rolling walker;Caregiver able to provide necessary level of assistance General ADL Comments: Pt and wife educated on ADL A/E and DME for home use for ADL safety. Pt and wife participated in toileting and dresing techniques in prep for d/c today     Vision Vision Assessment?: No apparent visual deficits              Pertinent Vitals/Pain Pain Assessment: 0-10 Pain Score: 4  Pain Location: L hip Pain Descriptors / Indicators: Aching;Tightness;Sore Pain Intervention(s): Premedicated before session;Monitored during session;Repositioned     Hand Dominance Right   Extremity/Trunk Assessment Upper Extremity Assessment Upper Extremity Assessment: Overall WFL for tasks assessed   Lower Extremity Assessment Lower Extremity Assessment: Defer to PT evaluation   Cervical / Trunk Assessment Cervical / Trunk Assessment: Normal   Communication Communication Communication: No difficulties   Cognition Arousal/Alertness: Awake/alert Behavior During Therapy: WFL for tasks assessed/performed Overall Cognitive Status: Within Functional Limits for tasks assessed                     General Comments   pt very pleasant and cooperative, wife suportive  Home Living Family/patient expects to be discharged to:: Private residence Living Arrangements: Spouse/significant other Available Help at Discharge: Family;Available 24 hours/day Type of Home: House   Entrance Stairs-Number of  Steps: 3 Entrance Stairs-Rails: Right;Left Home Layout: One level     Bathroom Shower/Tub: Occupational psychologist: Handicapped height     Home Equipment: Environmental consultant - 2 wheels;Cane - quad;Bedside commode          Prior Functioning/Environment Level of Independence: Independent                 OT Problem List: Pain;Decreased knowledge of use of DME or AE;Decreased knowledge of precautions   OT Treatment/Interventions:      OT Goals(Current goals can be found in the care plan section) Acute Rehab OT Goals Patient Stated Goal: be able to get back to exercise program OT Goal Formulation: With patient/family  OT Frequency:     Barriers to D/C:    no barriers                     End of Session Equipment Utilized During Treatment: Rolling walker;Other (comment) (3 in 1)  Activity Tolerance: Patient tolerated treatment well Patient left: in chair;with call bell/phone within reach;with family/visitor present   Time: [id number] OT Time Calculation (min): 36 min Charges:  OT General Charges $OT Visit: 1 Procedure OT Evaluation $OT Eval Low Complexity: 1 Procedure OT Treatments $Self Care/Home Management : 8-22 mins $Therapeutic Activity: 8-22 mins G-Codes:    Britt Bottom 04/22/2016, 12:51 PM

## 2016-04-23 DIAGNOSIS — Z96642 Presence of left artificial hip joint: Secondary | ICD-10-CM | POA: Diagnosis not present

## 2016-04-23 DIAGNOSIS — I4891 Unspecified atrial fibrillation: Secondary | ICD-10-CM | POA: Diagnosis not present

## 2016-04-23 DIAGNOSIS — I739 Peripheral vascular disease, unspecified: Secondary | ICD-10-CM | POA: Diagnosis not present

## 2016-04-23 DIAGNOSIS — I2510 Atherosclerotic heart disease of native coronary artery without angina pectoris: Secondary | ICD-10-CM | POA: Diagnosis not present

## 2016-04-23 DIAGNOSIS — M1991 Primary osteoarthritis, unspecified site: Secondary | ICD-10-CM | POA: Diagnosis not present

## 2016-04-23 DIAGNOSIS — Z471 Aftercare following joint replacement surgery: Secondary | ICD-10-CM | POA: Diagnosis not present

## 2016-04-27 DIAGNOSIS — I4891 Unspecified atrial fibrillation: Secondary | ICD-10-CM | POA: Diagnosis not present

## 2016-04-27 DIAGNOSIS — I739 Peripheral vascular disease, unspecified: Secondary | ICD-10-CM | POA: Diagnosis not present

## 2016-04-27 DIAGNOSIS — I2510 Atherosclerotic heart disease of native coronary artery without angina pectoris: Secondary | ICD-10-CM | POA: Diagnosis not present

## 2016-04-27 DIAGNOSIS — Z471 Aftercare following joint replacement surgery: Secondary | ICD-10-CM | POA: Diagnosis not present

## 2016-04-27 DIAGNOSIS — Z96642 Presence of left artificial hip joint: Secondary | ICD-10-CM | POA: Diagnosis not present

## 2016-04-27 DIAGNOSIS — M1991 Primary osteoarthritis, unspecified site: Secondary | ICD-10-CM | POA: Diagnosis not present

## 2016-04-29 DIAGNOSIS — I2510 Atherosclerotic heart disease of native coronary artery without angina pectoris: Secondary | ICD-10-CM | POA: Diagnosis not present

## 2016-04-29 DIAGNOSIS — Z96642 Presence of left artificial hip joint: Secondary | ICD-10-CM | POA: Diagnosis not present

## 2016-04-29 DIAGNOSIS — I4891 Unspecified atrial fibrillation: Secondary | ICD-10-CM | POA: Diagnosis not present

## 2016-04-29 DIAGNOSIS — I739 Peripheral vascular disease, unspecified: Secondary | ICD-10-CM | POA: Diagnosis not present

## 2016-04-29 DIAGNOSIS — Z471 Aftercare following joint replacement surgery: Secondary | ICD-10-CM | POA: Diagnosis not present

## 2016-04-29 DIAGNOSIS — M1991 Primary osteoarthritis, unspecified site: Secondary | ICD-10-CM | POA: Diagnosis not present

## 2016-05-01 DIAGNOSIS — I4891 Unspecified atrial fibrillation: Secondary | ICD-10-CM | POA: Diagnosis not present

## 2016-05-01 DIAGNOSIS — M1991 Primary osteoarthritis, unspecified site: Secondary | ICD-10-CM | POA: Diagnosis not present

## 2016-05-01 DIAGNOSIS — I739 Peripheral vascular disease, unspecified: Secondary | ICD-10-CM | POA: Diagnosis not present

## 2016-05-01 DIAGNOSIS — Z471 Aftercare following joint replacement surgery: Secondary | ICD-10-CM | POA: Diagnosis not present

## 2016-05-01 DIAGNOSIS — Z96642 Presence of left artificial hip joint: Secondary | ICD-10-CM | POA: Diagnosis not present

## 2016-05-01 DIAGNOSIS — I2510 Atherosclerotic heart disease of native coronary artery without angina pectoris: Secondary | ICD-10-CM | POA: Diagnosis not present

## 2016-05-04 DIAGNOSIS — Z96641 Presence of right artificial hip joint: Secondary | ICD-10-CM | POA: Diagnosis not present

## 2016-05-05 DIAGNOSIS — I739 Peripheral vascular disease, unspecified: Secondary | ICD-10-CM | POA: Diagnosis not present

## 2016-05-05 DIAGNOSIS — M1991 Primary osteoarthritis, unspecified site: Secondary | ICD-10-CM | POA: Diagnosis not present

## 2016-05-05 DIAGNOSIS — I4891 Unspecified atrial fibrillation: Secondary | ICD-10-CM | POA: Diagnosis not present

## 2016-05-05 DIAGNOSIS — I2510 Atherosclerotic heart disease of native coronary artery without angina pectoris: Secondary | ICD-10-CM | POA: Diagnosis not present

## 2016-05-05 DIAGNOSIS — Z471 Aftercare following joint replacement surgery: Secondary | ICD-10-CM | POA: Diagnosis not present

## 2016-05-05 DIAGNOSIS — Z96642 Presence of left artificial hip joint: Secondary | ICD-10-CM | POA: Diagnosis not present

## 2016-05-06 DIAGNOSIS — Z96642 Presence of left artificial hip joint: Secondary | ICD-10-CM | POA: Diagnosis not present

## 2016-05-06 DIAGNOSIS — I4891 Unspecified atrial fibrillation: Secondary | ICD-10-CM | POA: Diagnosis not present

## 2016-05-06 DIAGNOSIS — M1991 Primary osteoarthritis, unspecified site: Secondary | ICD-10-CM | POA: Diagnosis not present

## 2016-05-06 DIAGNOSIS — Z471 Aftercare following joint replacement surgery: Secondary | ICD-10-CM | POA: Diagnosis not present

## 2016-05-06 DIAGNOSIS — I2510 Atherosclerotic heart disease of native coronary artery without angina pectoris: Secondary | ICD-10-CM | POA: Diagnosis not present

## 2016-05-06 DIAGNOSIS — I739 Peripheral vascular disease, unspecified: Secondary | ICD-10-CM | POA: Diagnosis not present

## 2016-05-12 DIAGNOSIS — I2510 Atherosclerotic heart disease of native coronary artery without angina pectoris: Secondary | ICD-10-CM | POA: Diagnosis not present

## 2016-05-12 DIAGNOSIS — I739 Peripheral vascular disease, unspecified: Secondary | ICD-10-CM | POA: Diagnosis not present

## 2016-05-12 DIAGNOSIS — Z96642 Presence of left artificial hip joint: Secondary | ICD-10-CM | POA: Diagnosis not present

## 2016-05-12 DIAGNOSIS — M1991 Primary osteoarthritis, unspecified site: Secondary | ICD-10-CM | POA: Diagnosis not present

## 2016-05-12 DIAGNOSIS — I4891 Unspecified atrial fibrillation: Secondary | ICD-10-CM | POA: Diagnosis not present

## 2016-05-12 DIAGNOSIS — Z471 Aftercare following joint replacement surgery: Secondary | ICD-10-CM | POA: Diagnosis not present

## 2016-05-14 DIAGNOSIS — I739 Peripheral vascular disease, unspecified: Secondary | ICD-10-CM | POA: Diagnosis not present

## 2016-05-14 DIAGNOSIS — M1991 Primary osteoarthritis, unspecified site: Secondary | ICD-10-CM | POA: Diagnosis not present

## 2016-05-14 DIAGNOSIS — I4891 Unspecified atrial fibrillation: Secondary | ICD-10-CM | POA: Diagnosis not present

## 2016-05-14 DIAGNOSIS — Z96642 Presence of left artificial hip joint: Secondary | ICD-10-CM | POA: Diagnosis not present

## 2016-05-14 DIAGNOSIS — Z471 Aftercare following joint replacement surgery: Secondary | ICD-10-CM | POA: Diagnosis not present

## 2016-05-14 DIAGNOSIS — I2510 Atherosclerotic heart disease of native coronary artery without angina pectoris: Secondary | ICD-10-CM | POA: Diagnosis not present

## 2016-05-14 DIAGNOSIS — G4733 Obstructive sleep apnea (adult) (pediatric): Secondary | ICD-10-CM | POA: Diagnosis not present

## 2016-05-19 DIAGNOSIS — I2510 Atherosclerotic heart disease of native coronary artery without angina pectoris: Secondary | ICD-10-CM | POA: Diagnosis not present

## 2016-05-19 DIAGNOSIS — Z471 Aftercare following joint replacement surgery: Secondary | ICD-10-CM | POA: Diagnosis not present

## 2016-05-19 DIAGNOSIS — M1991 Primary osteoarthritis, unspecified site: Secondary | ICD-10-CM | POA: Diagnosis not present

## 2016-05-19 DIAGNOSIS — I739 Peripheral vascular disease, unspecified: Secondary | ICD-10-CM | POA: Diagnosis not present

## 2016-05-19 DIAGNOSIS — Z96642 Presence of left artificial hip joint: Secondary | ICD-10-CM | POA: Diagnosis not present

## 2016-05-19 DIAGNOSIS — I4891 Unspecified atrial fibrillation: Secondary | ICD-10-CM | POA: Diagnosis not present

## 2016-05-22 DIAGNOSIS — I2510 Atherosclerotic heart disease of native coronary artery without angina pectoris: Secondary | ICD-10-CM | POA: Diagnosis not present

## 2016-05-22 DIAGNOSIS — Z471 Aftercare following joint replacement surgery: Secondary | ICD-10-CM | POA: Diagnosis not present

## 2016-05-22 DIAGNOSIS — Z96642 Presence of left artificial hip joint: Secondary | ICD-10-CM | POA: Diagnosis not present

## 2016-05-22 DIAGNOSIS — M1991 Primary osteoarthritis, unspecified site: Secondary | ICD-10-CM | POA: Diagnosis not present

## 2016-05-22 DIAGNOSIS — I4891 Unspecified atrial fibrillation: Secondary | ICD-10-CM | POA: Diagnosis not present

## 2016-05-22 DIAGNOSIS — I739 Peripheral vascular disease, unspecified: Secondary | ICD-10-CM | POA: Diagnosis not present

## 2016-05-26 DIAGNOSIS — G4733 Obstructive sleep apnea (adult) (pediatric): Secondary | ICD-10-CM | POA: Diagnosis not present

## 2016-06-03 DIAGNOSIS — Z96642 Presence of left artificial hip joint: Secondary | ICD-10-CM | POA: Diagnosis not present

## 2016-06-14 DIAGNOSIS — G4733 Obstructive sleep apnea (adult) (pediatric): Secondary | ICD-10-CM | POA: Diagnosis not present

## 2016-07-14 DIAGNOSIS — G4733 Obstructive sleep apnea (adult) (pediatric): Secondary | ICD-10-CM | POA: Diagnosis not present

## 2016-08-14 DIAGNOSIS — G4733 Obstructive sleep apnea (adult) (pediatric): Secondary | ICD-10-CM | POA: Diagnosis not present

## 2016-08-15 NOTE — Addendum Note (Signed)
Addendum  created 08/15/16 0911 by Indra Wolters, MD   Sign clinical note    

## 2016-08-19 DIAGNOSIS — H01005 Unspecified blepharitis left lower eyelid: Secondary | ICD-10-CM | POA: Diagnosis not present

## 2016-08-19 DIAGNOSIS — H40053 Ocular hypertension, bilateral: Secondary | ICD-10-CM | POA: Diagnosis not present

## 2016-08-19 DIAGNOSIS — H01002 Unspecified blepharitis right lower eyelid: Secondary | ICD-10-CM | POA: Diagnosis not present

## 2016-08-19 DIAGNOSIS — H01004 Unspecified blepharitis left upper eyelid: Secondary | ICD-10-CM | POA: Diagnosis not present

## 2016-08-19 DIAGNOSIS — H01001 Unspecified blepharitis right upper eyelid: Secondary | ICD-10-CM | POA: Diagnosis not present

## 2016-08-19 DIAGNOSIS — H18413 Arcus senilis, bilateral: Secondary | ICD-10-CM | POA: Diagnosis not present

## 2016-08-19 DIAGNOSIS — H5213 Myopia, bilateral: Secondary | ICD-10-CM | POA: Diagnosis not present

## 2016-08-19 DIAGNOSIS — E119 Type 2 diabetes mellitus without complications: Secondary | ICD-10-CM | POA: Diagnosis not present

## 2016-08-19 DIAGNOSIS — Z961 Presence of intraocular lens: Secondary | ICD-10-CM | POA: Diagnosis not present

## 2016-08-25 DIAGNOSIS — G4733 Obstructive sleep apnea (adult) (pediatric): Secondary | ICD-10-CM | POA: Diagnosis not present

## 2016-08-31 DIAGNOSIS — I10 Essential (primary) hypertension: Secondary | ICD-10-CM | POA: Diagnosis not present

## 2016-08-31 DIAGNOSIS — D126 Benign neoplasm of colon, unspecified: Secondary | ICD-10-CM | POA: Diagnosis not present

## 2016-08-31 DIAGNOSIS — R569 Unspecified convulsions: Secondary | ICD-10-CM | POA: Diagnosis not present

## 2016-08-31 DIAGNOSIS — E119 Type 2 diabetes mellitus without complications: Secondary | ICD-10-CM | POA: Diagnosis not present

## 2016-08-31 DIAGNOSIS — E785 Hyperlipidemia, unspecified: Secondary | ICD-10-CM | POA: Diagnosis not present

## 2016-09-11 DIAGNOSIS — Z86010 Personal history of colonic polyps: Secondary | ICD-10-CM | POA: Diagnosis not present

## 2016-09-13 DIAGNOSIS — G4733 Obstructive sleep apnea (adult) (pediatric): Secondary | ICD-10-CM | POA: Diagnosis not present

## 2016-09-18 DIAGNOSIS — R809 Proteinuria, unspecified: Secondary | ICD-10-CM | POA: Diagnosis not present

## 2016-10-14 DIAGNOSIS — G4733 Obstructive sleep apnea (adult) (pediatric): Secondary | ICD-10-CM | POA: Diagnosis not present

## 2016-10-20 DIAGNOSIS — K5730 Diverticulosis of large intestine without perforation or abscess without bleeding: Secondary | ICD-10-CM | POA: Diagnosis not present

## 2016-10-20 DIAGNOSIS — D126 Benign neoplasm of colon, unspecified: Secondary | ICD-10-CM | POA: Diagnosis not present

## 2016-10-20 DIAGNOSIS — K640 First degree hemorrhoids: Secondary | ICD-10-CM | POA: Diagnosis not present

## 2016-10-20 DIAGNOSIS — Z86010 Personal history of colonic polyps: Secondary | ICD-10-CM | POA: Diagnosis not present

## 2016-10-22 DIAGNOSIS — D126 Benign neoplasm of colon, unspecified: Secondary | ICD-10-CM | POA: Diagnosis not present

## 2016-11-14 DIAGNOSIS — G4733 Obstructive sleep apnea (adult) (pediatric): Secondary | ICD-10-CM | POA: Diagnosis not present

## 2016-11-23 DIAGNOSIS — G4733 Obstructive sleep apnea (adult) (pediatric): Secondary | ICD-10-CM | POA: Diagnosis not present

## 2016-12-07 DIAGNOSIS — Z1389 Encounter for screening for other disorder: Secondary | ICD-10-CM | POA: Diagnosis not present

## 2016-12-07 DIAGNOSIS — E785 Hyperlipidemia, unspecified: Secondary | ICD-10-CM | POA: Diagnosis not present

## 2016-12-07 DIAGNOSIS — I10 Essential (primary) hypertension: Secondary | ICD-10-CM | POA: Diagnosis not present

## 2016-12-07 DIAGNOSIS — R801 Persistent proteinuria, unspecified: Secondary | ICD-10-CM | POA: Diagnosis not present

## 2016-12-07 DIAGNOSIS — R569 Unspecified convulsions: Secondary | ICD-10-CM | POA: Diagnosis not present

## 2016-12-07 DIAGNOSIS — E119 Type 2 diabetes mellitus without complications: Secondary | ICD-10-CM | POA: Diagnosis not present

## 2016-12-14 DIAGNOSIS — G4733 Obstructive sleep apnea (adult) (pediatric): Secondary | ICD-10-CM | POA: Diagnosis not present

## 2017-01-14 DIAGNOSIS — G4733 Obstructive sleep apnea (adult) (pediatric): Secondary | ICD-10-CM | POA: Diagnosis not present

## 2017-02-22 DIAGNOSIS — G4733 Obstructive sleep apnea (adult) (pediatric): Secondary | ICD-10-CM | POA: Diagnosis not present

## 2017-03-11 DIAGNOSIS — E119 Type 2 diabetes mellitus without complications: Secondary | ICD-10-CM | POA: Diagnosis not present

## 2017-03-11 DIAGNOSIS — E7800 Pure hypercholesterolemia, unspecified: Secondary | ICD-10-CM | POA: Diagnosis not present

## 2017-03-11 DIAGNOSIS — I10 Essential (primary) hypertension: Secondary | ICD-10-CM | POA: Diagnosis not present

## 2017-03-11 DIAGNOSIS — R569 Unspecified convulsions: Secondary | ICD-10-CM | POA: Diagnosis not present

## 2017-03-17 DIAGNOSIS — H401111 Primary open-angle glaucoma, right eye, mild stage: Secondary | ICD-10-CM | POA: Diagnosis not present

## 2017-03-17 DIAGNOSIS — H40052 Ocular hypertension, left eye: Secondary | ICD-10-CM | POA: Diagnosis not present

## 2017-05-26 DIAGNOSIS — G4733 Obstructive sleep apnea (adult) (pediatric): Secondary | ICD-10-CM | POA: Diagnosis not present

## 2017-06-18 DIAGNOSIS — Z96643 Presence of artificial hip joint, bilateral: Secondary | ICD-10-CM | POA: Diagnosis not present

## 2017-06-18 DIAGNOSIS — M1712 Unilateral primary osteoarthritis, left knee: Secondary | ICD-10-CM | POA: Diagnosis not present

## 2017-07-20 DIAGNOSIS — E119 Type 2 diabetes mellitus without complications: Secondary | ICD-10-CM | POA: Diagnosis not present

## 2017-07-20 DIAGNOSIS — H40052 Ocular hypertension, left eye: Secondary | ICD-10-CM | POA: Diagnosis not present

## 2017-07-20 DIAGNOSIS — H0100B Unspecified blepharitis left eye, upper and lower eyelids: Secondary | ICD-10-CM | POA: Diagnosis not present

## 2017-07-20 DIAGNOSIS — H52203 Unspecified astigmatism, bilateral: Secondary | ICD-10-CM | POA: Diagnosis not present

## 2017-07-20 DIAGNOSIS — H18413 Arcus senilis, bilateral: Secondary | ICD-10-CM | POA: Diagnosis not present

## 2017-07-20 DIAGNOSIS — H0100A Unspecified blepharitis right eye, upper and lower eyelids: Secondary | ICD-10-CM | POA: Diagnosis not present

## 2017-07-20 DIAGNOSIS — H401111 Primary open-angle glaucoma, right eye, mild stage: Secondary | ICD-10-CM | POA: Diagnosis not present

## 2017-07-20 DIAGNOSIS — Z961 Presence of intraocular lens: Secondary | ICD-10-CM | POA: Diagnosis not present

## 2017-07-20 DIAGNOSIS — H26491 Other secondary cataract, right eye: Secondary | ICD-10-CM | POA: Diagnosis not present

## 2017-07-26 DIAGNOSIS — M1712 Unilateral primary osteoarthritis, left knee: Secondary | ICD-10-CM | POA: Diagnosis not present

## 2017-08-13 IMAGING — CR DG HIP (WITH OR WITHOUT PELVIS) 1V PORT*L*
2 series · 2 of 2 positions shown · non-contrast
Comparison: AP pelvis of May 30, 2007. No more recent studies are
available.

CLINICAL DATA: Less post left total hip arthroplasty.

EXAM:
DG HIP (WITH OR WITHOUT PELVIS) 1V PORT LEFT

[ap]
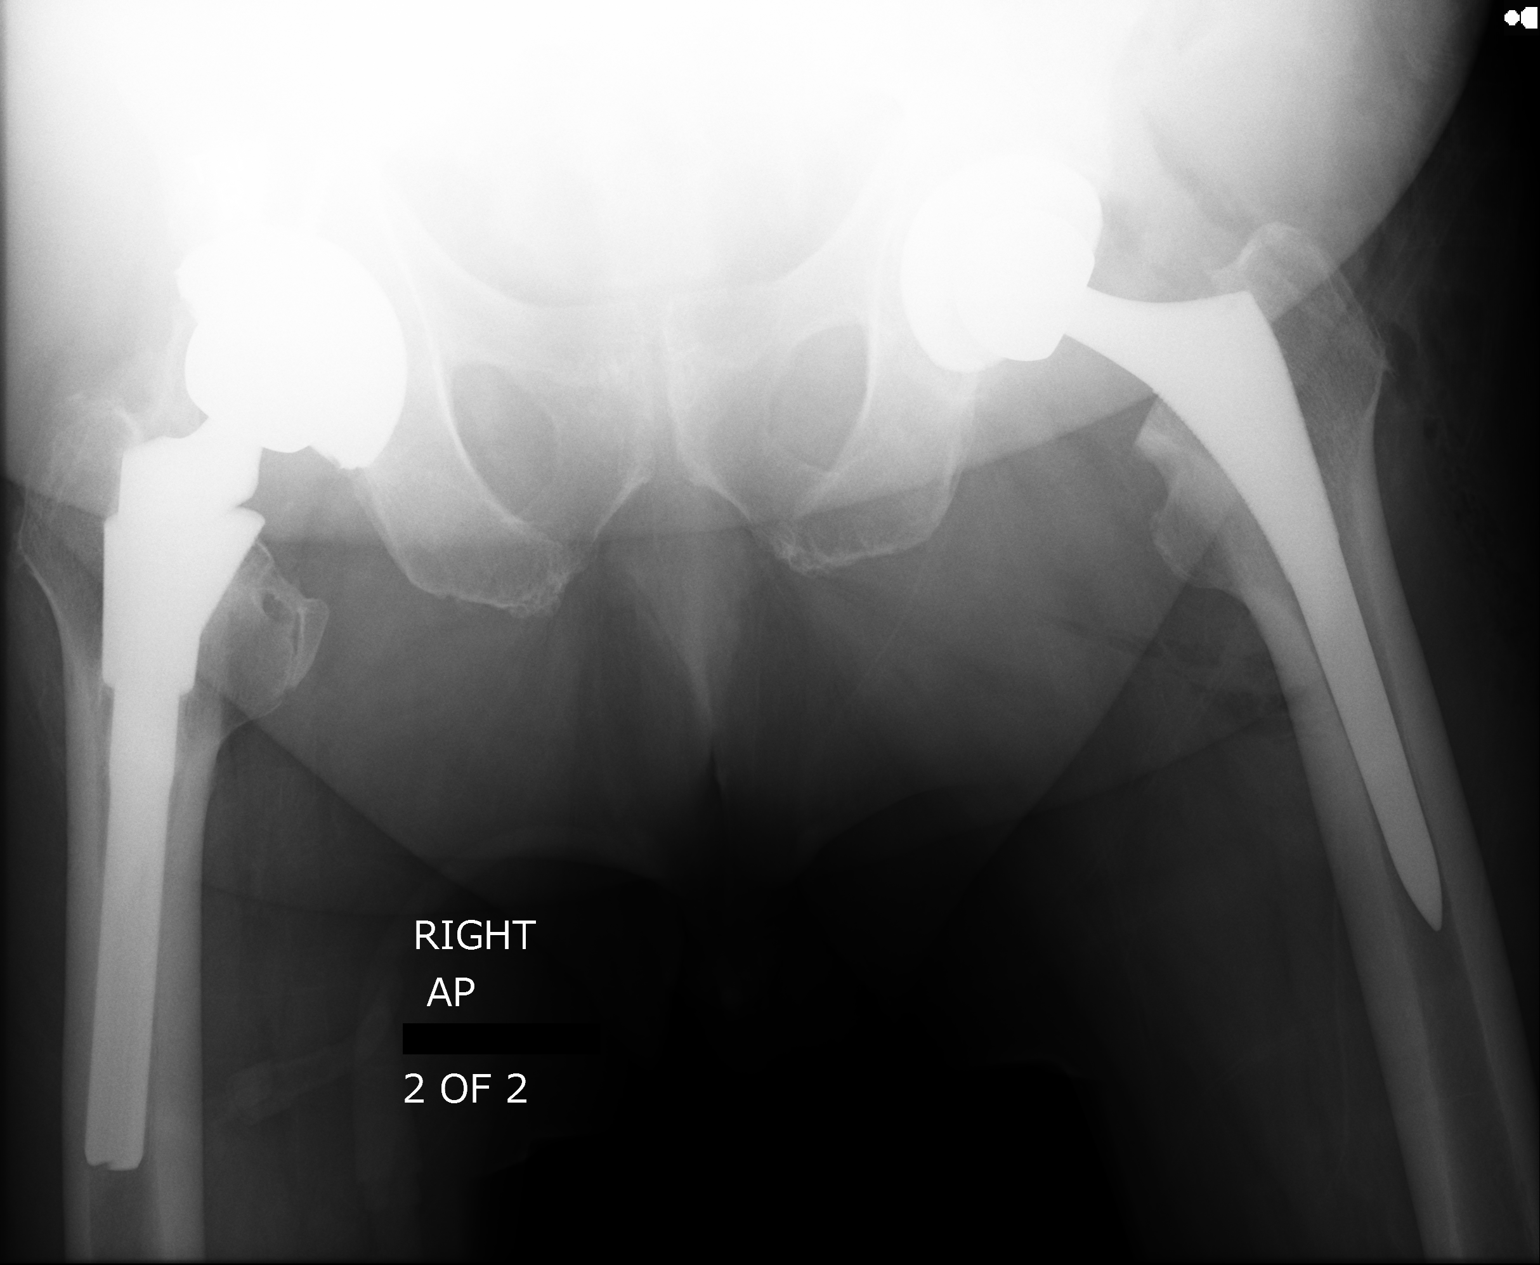

[frog lat]
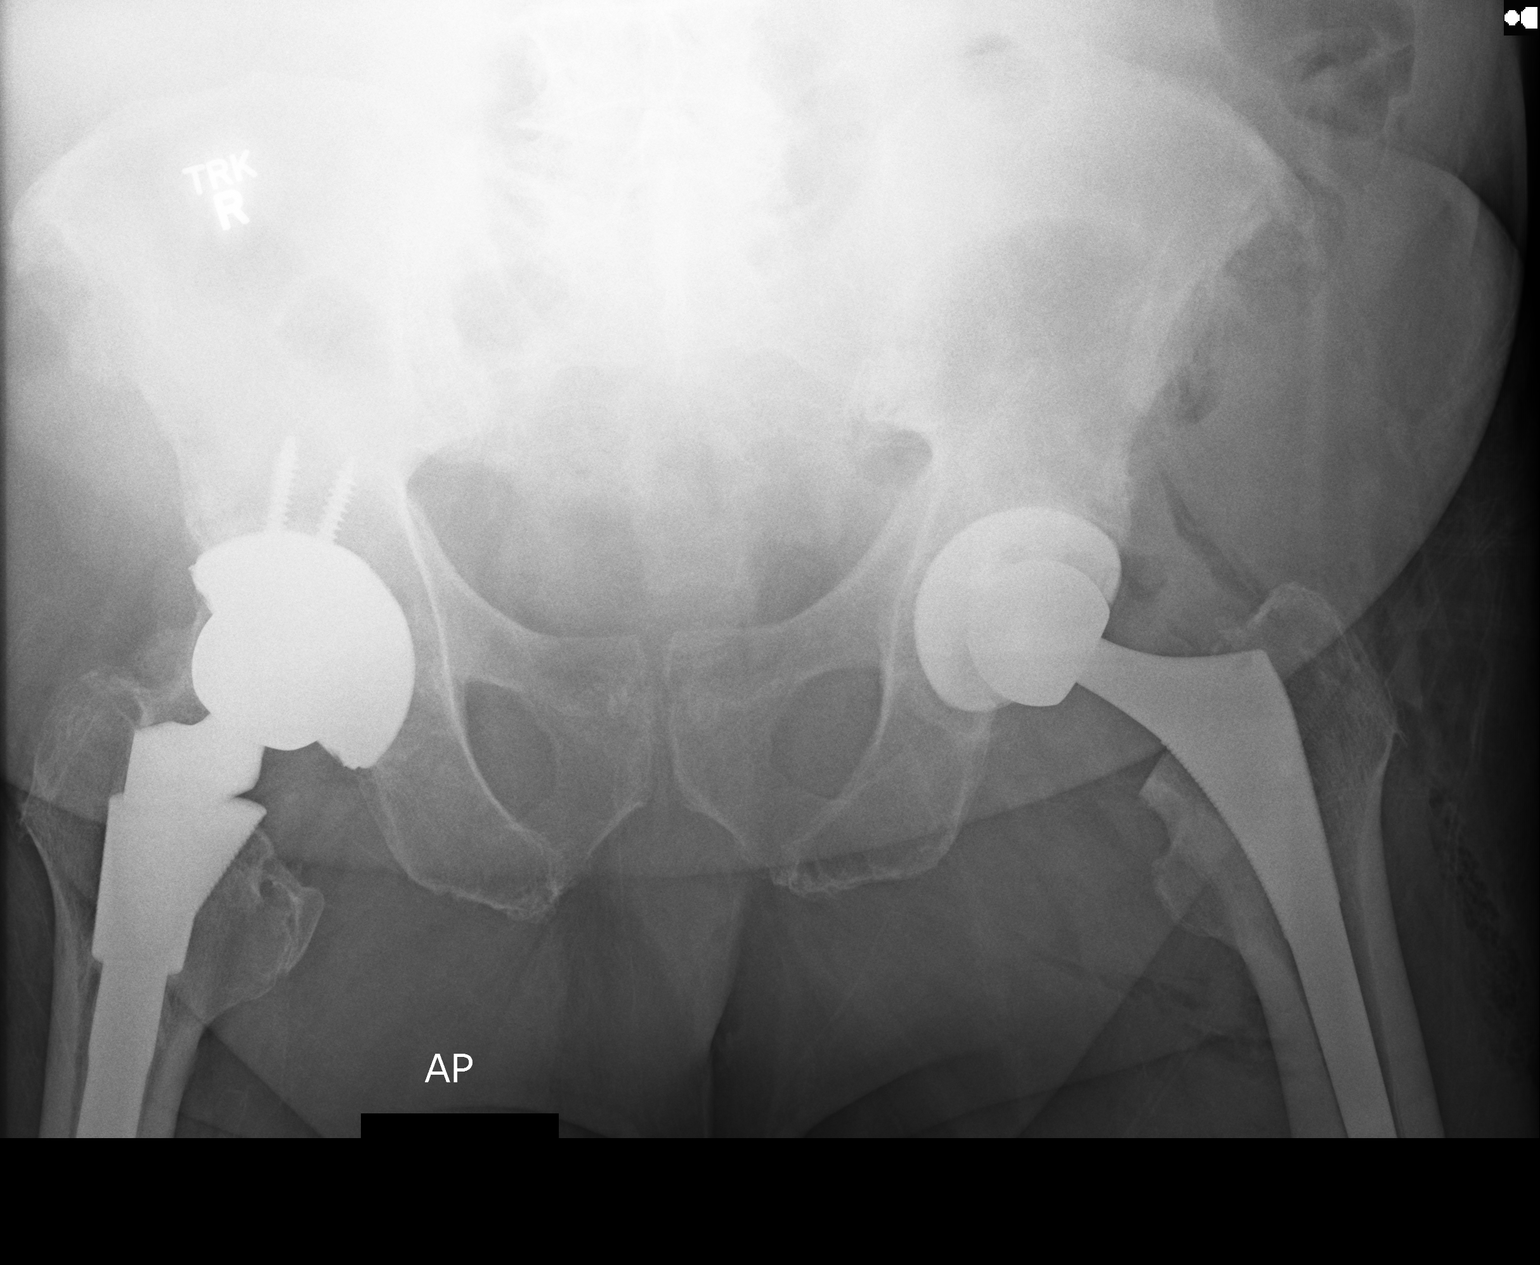

[2 of 2 positions shown; findings below may reference images not displayed]

FINDINGS: There is a pre-existing right total hip joint prosthesis. On the
left a prosthesis is present. Radiographic positioning of the
prosthetic components is good. The interface with the native bone
appears normal. No acute native bone abnormality is observed.
IMPRESSION: No immediate postprocedure complication following left total hip
arthroplasty.

## 2017-08-23 DIAGNOSIS — R569 Unspecified convulsions: Secondary | ICD-10-CM | POA: Diagnosis not present

## 2017-08-23 DIAGNOSIS — E7800 Pure hypercholesterolemia, unspecified: Secondary | ICD-10-CM | POA: Diagnosis not present

## 2017-08-23 DIAGNOSIS — G4733 Obstructive sleep apnea (adult) (pediatric): Secondary | ICD-10-CM | POA: Diagnosis not present

## 2017-08-23 DIAGNOSIS — E119 Type 2 diabetes mellitus without complications: Secondary | ICD-10-CM | POA: Diagnosis not present

## 2017-08-23 DIAGNOSIS — I10 Essential (primary) hypertension: Secondary | ICD-10-CM | POA: Diagnosis not present

## 2017-11-22 DIAGNOSIS — G4733 Obstructive sleep apnea (adult) (pediatric): Secondary | ICD-10-CM | POA: Diagnosis not present

## 2017-11-29 DIAGNOSIS — Z125 Encounter for screening for malignant neoplasm of prostate: Secondary | ICD-10-CM | POA: Diagnosis not present

## 2017-11-29 DIAGNOSIS — E119 Type 2 diabetes mellitus without complications: Secondary | ICD-10-CM | POA: Diagnosis not present

## 2017-11-29 DIAGNOSIS — E7800 Pure hypercholesterolemia, unspecified: Secondary | ICD-10-CM | POA: Diagnosis not present

## 2017-11-29 DIAGNOSIS — I10 Essential (primary) hypertension: Secondary | ICD-10-CM | POA: Diagnosis not present

## 2017-11-29 DIAGNOSIS — R569 Unspecified convulsions: Secondary | ICD-10-CM | POA: Diagnosis not present

## 2018-02-22 DIAGNOSIS — G4733 Obstructive sleep apnea (adult) (pediatric): Secondary | ICD-10-CM | POA: Diagnosis not present

## 2018-02-24 DIAGNOSIS — R569 Unspecified convulsions: Secondary | ICD-10-CM | POA: Diagnosis not present

## 2018-02-24 DIAGNOSIS — E7800 Pure hypercholesterolemia, unspecified: Secondary | ICD-10-CM | POA: Diagnosis not present

## 2018-02-24 DIAGNOSIS — E119 Type 2 diabetes mellitus without complications: Secondary | ICD-10-CM | POA: Diagnosis not present

## 2018-02-24 DIAGNOSIS — I10 Essential (primary) hypertension: Secondary | ICD-10-CM | POA: Diagnosis not present

## 2018-05-24 DIAGNOSIS — G4733 Obstructive sleep apnea (adult) (pediatric): Secondary | ICD-10-CM | POA: Diagnosis not present

## 2018-05-26 DIAGNOSIS — R569 Unspecified convulsions: Secondary | ICD-10-CM | POA: Diagnosis not present

## 2018-05-26 DIAGNOSIS — E119 Type 2 diabetes mellitus without complications: Secondary | ICD-10-CM | POA: Diagnosis not present

## 2018-05-26 DIAGNOSIS — E7800 Pure hypercholesterolemia, unspecified: Secondary | ICD-10-CM | POA: Diagnosis not present

## 2018-05-26 DIAGNOSIS — Z0000 Encounter for general adult medical examination without abnormal findings: Secondary | ICD-10-CM | POA: Diagnosis not present

## 2018-05-26 DIAGNOSIS — Z85828 Personal history of other malignant neoplasm of skin: Secondary | ICD-10-CM | POA: Diagnosis not present

## 2018-05-26 DIAGNOSIS — I10 Essential (primary) hypertension: Secondary | ICD-10-CM | POA: Diagnosis not present

## 2018-05-26 DIAGNOSIS — Z6841 Body Mass Index (BMI) 40.0 and over, adult: Secondary | ICD-10-CM | POA: Diagnosis not present

## 2018-07-12 DIAGNOSIS — Z951 Presence of aortocoronary bypass graft: Secondary | ICD-10-CM | POA: Diagnosis not present

## 2018-07-12 DIAGNOSIS — I2510 Atherosclerotic heart disease of native coronary artery without angina pectoris: Secondary | ICD-10-CM | POA: Diagnosis not present

## 2018-08-22 DIAGNOSIS — G4733 Obstructive sleep apnea (adult) (pediatric): Secondary | ICD-10-CM | POA: Diagnosis not present

## 2018-10-03 DIAGNOSIS — H26491 Other secondary cataract, right eye: Secondary | ICD-10-CM | POA: Diagnosis not present

## 2018-11-22 DIAGNOSIS — G4733 Obstructive sleep apnea (adult) (pediatric): Secondary | ICD-10-CM | POA: Diagnosis not present

## 2018-11-28 DIAGNOSIS — H401111 Primary open-angle glaucoma, right eye, mild stage: Secondary | ICD-10-CM | POA: Diagnosis not present

## 2018-11-28 DIAGNOSIS — H40052 Ocular hypertension, left eye: Secondary | ICD-10-CM | POA: Diagnosis not present

## 2018-11-30 DIAGNOSIS — E7800 Pure hypercholesterolemia, unspecified: Secondary | ICD-10-CM | POA: Diagnosis not present

## 2018-11-30 DIAGNOSIS — E119 Type 2 diabetes mellitus without complications: Secondary | ICD-10-CM | POA: Diagnosis not present

## 2018-11-30 DIAGNOSIS — I10 Essential (primary) hypertension: Secondary | ICD-10-CM | POA: Diagnosis not present

## 2018-11-30 DIAGNOSIS — R569 Unspecified convulsions: Secondary | ICD-10-CM | POA: Diagnosis not present

## 2018-12-28 DIAGNOSIS — E7800 Pure hypercholesterolemia, unspecified: Secondary | ICD-10-CM | POA: Diagnosis not present

## 2018-12-28 DIAGNOSIS — E119 Type 2 diabetes mellitus without complications: Secondary | ICD-10-CM | POA: Diagnosis not present

## 2018-12-28 DIAGNOSIS — Z7984 Long term (current) use of oral hypoglycemic drugs: Secondary | ICD-10-CM | POA: Diagnosis not present

## 2018-12-28 DIAGNOSIS — R569 Unspecified convulsions: Secondary | ICD-10-CM | POA: Diagnosis not present

## 2018-12-28 DIAGNOSIS — I10 Essential (primary) hypertension: Secondary | ICD-10-CM | POA: Diagnosis not present

## 2019-01-06 ENCOUNTER — Other Ambulatory Visit: Payer: Self-pay

## 2019-01-06 ENCOUNTER — Encounter: Payer: Self-pay | Admitting: Registered"

## 2019-01-06 ENCOUNTER — Encounter: Payer: Medicare HMO | Attending: Family Medicine | Admitting: Registered"

## 2019-01-06 DIAGNOSIS — E118 Type 2 diabetes mellitus with unspecified complications: Secondary | ICD-10-CM | POA: Diagnosis not present

## 2019-01-06 NOTE — Progress Notes (Signed)
Diabetes Self-Management Education  Visit Type: First/Initial  Appt. Start Time: 0930 Appt. End Time: [id number]  01/06/2019  [name], identified by name and date of birth, is a 73 y.o. male with a diagnosis of Diabetes: Type 2.   ASSESSMENT  There were no vitals taken for this visit. There is no height or weight on file to calculate BMI.   Medication: Pt states he had been on 500 mg of metformin until August and MD increased to BID. Pt has noticed a decrease in his FBG. Pt states he has also increased water intake. A1c has reduced from 7.7% in March to 7.5% in August.  SMBG: Pt state he doesn't like checking his BG because his fingers are getting sore. Pt reports he doesn't use lancing device, just uses the lancet to poke himself. Checks 2x/week.  Pt states prior to COVID he was exercising 5x week >60 min at the gym, enjoyed the seated rowing machine because it supported his hips. Pt states some times he will walk around a store for exercise now. Pt states standing is hard on feet, but as long as he is moving is fine. Pt reports some numbness, feels like a bunion behind big and little toes, patient states he has discussed with MD and was told to keep a watch on it.  Pt retired in 2010. Pt states his wife is working from home and he usually is the one to [employer]. Water 64 oz per day. Pt states he feels the thing he needs to work on is portion control. Pt states dinner and lunch are usually eaten while watching TV. Discussed mindful eating.  Diabetes Self-Management Education - 01/06/19 0940      Visit Information   Visit Type  First/Initial      Initial Visit   Diabetes Type  Type 2    Are you currently following a meal plan?  No    Are you taking your medications as prescribed?  Yes   metformin 1000 mg   Date Diagnosed  2017 or 2018      Health Coping   How would you rate your overall health?  Good      Psychosocial Assessment   Patient Belief/Attitude about Diabetes   Motivated to manage diabetes    How often do you need to have someone help you when you read instructions, pamphlets, or other written materials from your doctor or pharmacy?  1 - Never    What is the last grade level you completed in school?  BA      Complications   Last HgB A1C per patient/outside source  7.5 %   per pt   How often do you check your blood sugar?  3-4 times / week    Fasting Blood glucose range (mg/dL)  70-129;130-179   118-123; occassional up to 130   Have you had a dilated eye exam in the past 12 months?  Yes    Have you had a dental exam in the past 12 months?  No    Are you checking your feet?  Yes    How many days per week are you checking your feet?  1      Dietary Intake   Breakfast  multigrain cheerios with banana OR raisin bran OR occassionally egg, toast    Snack (morning)  raisins or [place] ff strawberry yogurt    Lunch  bbq, french fries, goulash    Snack (afternoon)  apple    [employer]  chilli OR spaghetti, meat sauce, salad OR salad    Snack (evening)  sometimes fat free ice cream or banana bread    Beverage(s)  water, fat free milk, diet pepsi sometimes      Exercise   Exercise Type  ADL's    How many days per week to you exercise?  0    How many minutes per day do you exercise?  0    Total minutes per week of exercise  0      Patient Education   Previous Diabetes Education  No    Nutrition management   Role of diet in the treatment of diabetes and the relationship between the three main macronutrients and blood glucose level    Physical activity and exercise   Role of exercise on diabetes management, blood pressure control and cardiac health.    Medications  Reviewed patients medication for diabetes, action, purpose, timing of dose and side effects.    Monitoring  Identified appropriate SMBG and/or A1C goals.      Individualized Goals (developed by patient)   Nutrition  General guidelines for healthy choices and portions discussed    Medications   take my medication as prescribed    Monitoring   test my blood glucose as discussed      Outcomes   Expected Outcomes  Demonstrated interest in learning. Expect positive outcomes    Future DMSE  PRN    Program Status  Completed       Individualized Plan for Diabetes Self-Management Training:   Learning Objective:  Patient will have a greater understanding of diabetes self-management. Patient education plan is to attend individual and/or group sessions per assessed needs and concerns.  Patient Instructions  If you want to try a different lancing device, your insurance company may cover the Accu-chek guide lancing device with the drum so you don't have to mess with individual needles.  Aim for balanced meals and snacks.  Consider measuring your rice or pasta meals to see what 1 c looks like on your plate.  If you are curious how a meal affected your blood sugar you can check 2 hours after a meal and as long as it is less than 180 it is in the range of an A1c of 7%  Consider finding an alternative exercise until it is safe to go back to the gym. General recommendation is 3-5 times per week, 30-45 min for a total of 150 min per week.    Expected Outcomes:  Demonstrated interest in learning. Expect positive outcomes  Education material provided: My Plate  If problems or questions, patient to contact team via:  Phone  Future DSME appointment: PRN

## 2019-01-06 NOTE — Patient Instructions (Addendum)
If you want to try a different lancing device, your insurance company may cover the Accu-chek guide lancing device with the drum so you don't have to mess with individual needles.  Aim for balanced meals and snacks.  Consider measuring your rice or pasta meals to see what 1 c looks like on your plate.  If you are curious how a meal affected your blood sugar you can check 2 hours after a meal and as long as it is less than 180 it is in the range of an A1c of 7%  Consider finding an alternative exercise until it is safe to go back to the gym. General recommendation is 3-5 times per week, 30-45 min for a total of 150 min per week.

## 2019-02-21 DIAGNOSIS — G4733 Obstructive sleep apnea (adult) (pediatric): Secondary | ICD-10-CM | POA: Diagnosis not present

## 2019-03-30 DIAGNOSIS — H401111 Primary open-angle glaucoma, right eye, mild stage: Secondary | ICD-10-CM | POA: Diagnosis not present

## 2019-03-30 DIAGNOSIS — H40052 Ocular hypertension, left eye: Secondary | ICD-10-CM | POA: Diagnosis not present

## 2019-05-22 DIAGNOSIS — G4733 Obstructive sleep apnea (adult) (pediatric): Secondary | ICD-10-CM | POA: Diagnosis not present

## 2019-05-30 DIAGNOSIS — E7800 Pure hypercholesterolemia, unspecified: Secondary | ICD-10-CM | POA: Diagnosis not present

## 2019-05-30 DIAGNOSIS — R569 Unspecified convulsions: Secondary | ICD-10-CM | POA: Diagnosis not present

## 2019-05-30 DIAGNOSIS — Z125 Encounter for screening for malignant neoplasm of prostate: Secondary | ICD-10-CM | POA: Diagnosis not present

## 2019-05-30 DIAGNOSIS — E119 Type 2 diabetes mellitus without complications: Secondary | ICD-10-CM | POA: Diagnosis not present

## 2019-05-30 DIAGNOSIS — Z0000 Encounter for general adult medical examination without abnormal findings: Secondary | ICD-10-CM | POA: Diagnosis not present

## 2019-05-30 DIAGNOSIS — Z85828 Personal history of other malignant neoplasm of skin: Secondary | ICD-10-CM | POA: Diagnosis not present

## 2019-05-30 DIAGNOSIS — I10 Essential (primary) hypertension: Secondary | ICD-10-CM | POA: Diagnosis not present

## 2019-05-30 DIAGNOSIS — Z1159 Encounter for screening for other viral diseases: Secondary | ICD-10-CM | POA: Diagnosis not present

## 2019-06-27 DIAGNOSIS — E119 Type 2 diabetes mellitus without complications: Secondary | ICD-10-CM | POA: Diagnosis not present

## 2019-06-27 DIAGNOSIS — E1165 Type 2 diabetes mellitus with hyperglycemia: Secondary | ICD-10-CM | POA: Diagnosis not present

## 2019-06-27 DIAGNOSIS — E7800 Pure hypercholesterolemia, unspecified: Secondary | ICD-10-CM | POA: Diagnosis not present

## 2019-06-27 DIAGNOSIS — I10 Essential (primary) hypertension: Secondary | ICD-10-CM | POA: Diagnosis not present

## 2019-07-31 DIAGNOSIS — Z961 Presence of intraocular lens: Secondary | ICD-10-CM | POA: Diagnosis not present

## 2019-07-31 DIAGNOSIS — E119 Type 2 diabetes mellitus without complications: Secondary | ICD-10-CM | POA: Diagnosis not present

## 2019-07-31 DIAGNOSIS — H401111 Primary open-angle glaucoma, right eye, mild stage: Secondary | ICD-10-CM | POA: Diagnosis not present

## 2019-07-31 DIAGNOSIS — H40052 Ocular hypertension, left eye: Secondary | ICD-10-CM | POA: Diagnosis not present

## 2019-07-31 DIAGNOSIS — Z7984 Long term (current) use of oral hypoglycemic drugs: Secondary | ICD-10-CM | POA: Diagnosis not present

## 2019-07-31 DIAGNOSIS — H18413 Arcus senilis, bilateral: Secondary | ICD-10-CM | POA: Diagnosis not present

## 2019-08-22 DIAGNOSIS — G4733 Obstructive sleep apnea (adult) (pediatric): Secondary | ICD-10-CM | POA: Diagnosis not present

## 2019-09-07 DIAGNOSIS — I10 Essential (primary) hypertension: Secondary | ICD-10-CM | POA: Diagnosis not present

## 2019-09-07 DIAGNOSIS — E119 Type 2 diabetes mellitus without complications: Secondary | ICD-10-CM | POA: Diagnosis not present

## 2019-09-07 DIAGNOSIS — E1165 Type 2 diabetes mellitus with hyperglycemia: Secondary | ICD-10-CM | POA: Diagnosis not present

## 2019-09-07 DIAGNOSIS — E7800 Pure hypercholesterolemia, unspecified: Secondary | ICD-10-CM | POA: Diagnosis not present

## 2019-10-26 DIAGNOSIS — D229 Melanocytic nevi, unspecified: Secondary | ICD-10-CM | POA: Diagnosis not present

## 2019-10-26 DIAGNOSIS — C44229 Squamous cell carcinoma of skin of left ear and external auricular canal: Secondary | ICD-10-CM | POA: Diagnosis not present

## 2019-10-26 DIAGNOSIS — D1801 Hemangioma of skin and subcutaneous tissue: Secondary | ICD-10-CM | POA: Diagnosis not present

## 2019-10-26 DIAGNOSIS — X32XXXS Exposure to sunlight, sequela: Secondary | ICD-10-CM | POA: Diagnosis not present

## 2019-10-26 DIAGNOSIS — L814 Other melanin hyperpigmentation: Secondary | ICD-10-CM | POA: Diagnosis not present

## 2019-10-26 DIAGNOSIS — C44329 Squamous cell carcinoma of skin of other parts of face: Secondary | ICD-10-CM | POA: Diagnosis not present

## 2019-10-26 DIAGNOSIS — L821 Other seborrheic keratosis: Secondary | ICD-10-CM | POA: Diagnosis not present

## 2019-10-26 DIAGNOSIS — D485 Neoplasm of uncertain behavior of skin: Secondary | ICD-10-CM | POA: Diagnosis not present

## 2019-11-06 DIAGNOSIS — L570 Actinic keratosis: Secondary | ICD-10-CM | POA: Diagnosis not present

## 2019-11-06 DIAGNOSIS — C44229 Squamous cell carcinoma of skin of left ear and external auricular canal: Secondary | ICD-10-CM | POA: Diagnosis not present

## 2019-11-06 DIAGNOSIS — C44329 Squamous cell carcinoma of skin of other parts of face: Secondary | ICD-10-CM | POA: Diagnosis not present

## 2019-11-10 DIAGNOSIS — E1165 Type 2 diabetes mellitus with hyperglycemia: Secondary | ICD-10-CM | POA: Diagnosis not present

## 2019-11-10 DIAGNOSIS — E7800 Pure hypercholesterolemia, unspecified: Secondary | ICD-10-CM | POA: Diagnosis not present

## 2019-11-10 DIAGNOSIS — E119 Type 2 diabetes mellitus without complications: Secondary | ICD-10-CM | POA: Diagnosis not present

## 2019-11-10 DIAGNOSIS — I10 Essential (primary) hypertension: Secondary | ICD-10-CM | POA: Diagnosis not present

## 2019-11-21 DIAGNOSIS — C44329 Squamous cell carcinoma of skin of other parts of face: Secondary | ICD-10-CM | POA: Diagnosis not present

## 2019-11-22 DIAGNOSIS — G4733 Obstructive sleep apnea (adult) (pediatric): Secondary | ICD-10-CM | POA: Diagnosis not present

## 2019-12-01 DIAGNOSIS — G40909 Epilepsy, unspecified, not intractable, without status epilepticus: Secondary | ICD-10-CM | POA: Diagnosis not present

## 2019-12-01 DIAGNOSIS — I10 Essential (primary) hypertension: Secondary | ICD-10-CM | POA: Diagnosis not present

## 2019-12-01 DIAGNOSIS — E7800 Pure hypercholesterolemia, unspecified: Secondary | ICD-10-CM | POA: Diagnosis not present

## 2019-12-01 DIAGNOSIS — E1165 Type 2 diabetes mellitus with hyperglycemia: Secondary | ICD-10-CM | POA: Diagnosis not present

## 2019-12-01 DIAGNOSIS — Z23 Encounter for immunization: Secondary | ICD-10-CM | POA: Diagnosis not present

## 2019-12-01 DIAGNOSIS — Z794 Long term (current) use of insulin: Secondary | ICD-10-CM | POA: Diagnosis not present

## 2019-12-01 DIAGNOSIS — Z7984 Long term (current) use of oral hypoglycemic drugs: Secondary | ICD-10-CM | POA: Diagnosis not present

## 2019-12-11 DIAGNOSIS — H401111 Primary open-angle glaucoma, right eye, mild stage: Secondary | ICD-10-CM | POA: Diagnosis not present

## 2019-12-11 DIAGNOSIS — H40052 Ocular hypertension, left eye: Secondary | ICD-10-CM | POA: Diagnosis not present

## 2019-12-27 DIAGNOSIS — L578 Other skin changes due to chronic exposure to nonionizing radiation: Secondary | ICD-10-CM | POA: Diagnosis not present

## 2019-12-27 DIAGNOSIS — D1801 Hemangioma of skin and subcutaneous tissue: Secondary | ICD-10-CM | POA: Diagnosis not present

## 2019-12-27 DIAGNOSIS — L905 Scar conditions and fibrosis of skin: Secondary | ICD-10-CM | POA: Diagnosis not present

## 2019-12-27 DIAGNOSIS — D239 Other benign neoplasm of skin, unspecified: Secondary | ICD-10-CM | POA: Diagnosis not present

## 2019-12-27 DIAGNOSIS — X32XXXS Exposure to sunlight, sequela: Secondary | ICD-10-CM | POA: Diagnosis not present

## 2019-12-27 DIAGNOSIS — L570 Actinic keratosis: Secondary | ICD-10-CM | POA: Diagnosis not present

## 2019-12-27 DIAGNOSIS — L821 Other seborrheic keratosis: Secondary | ICD-10-CM | POA: Diagnosis not present

## 2019-12-27 DIAGNOSIS — W908XXS Exposure to other nonionizing radiation, sequela: Secondary | ICD-10-CM | POA: Diagnosis not present

## 2019-12-27 DIAGNOSIS — L814 Other melanin hyperpigmentation: Secondary | ICD-10-CM | POA: Diagnosis not present

## 2020-01-11 DIAGNOSIS — I10 Essential (primary) hypertension: Secondary | ICD-10-CM | POA: Diagnosis not present

## 2020-01-11 DIAGNOSIS — E1165 Type 2 diabetes mellitus with hyperglycemia: Secondary | ICD-10-CM | POA: Diagnosis not present

## 2020-01-11 DIAGNOSIS — E7800 Pure hypercholesterolemia, unspecified: Secondary | ICD-10-CM | POA: Diagnosis not present

## 2020-01-11 DIAGNOSIS — E119 Type 2 diabetes mellitus without complications: Secondary | ICD-10-CM | POA: Diagnosis not present

## 2020-02-13 DIAGNOSIS — E1165 Type 2 diabetes mellitus with hyperglycemia: Secondary | ICD-10-CM | POA: Diagnosis not present

## 2020-02-13 DIAGNOSIS — E7800 Pure hypercholesterolemia, unspecified: Secondary | ICD-10-CM | POA: Diagnosis not present

## 2020-02-13 DIAGNOSIS — E119 Type 2 diabetes mellitus without complications: Secondary | ICD-10-CM | POA: Diagnosis not present

## 2020-02-13 DIAGNOSIS — I10 Essential (primary) hypertension: Secondary | ICD-10-CM | POA: Diagnosis not present

## 2020-02-21 DIAGNOSIS — G4733 Obstructive sleep apnea (adult) (pediatric): Secondary | ICD-10-CM | POA: Diagnosis not present

## 2020-04-15 DIAGNOSIS — E1165 Type 2 diabetes mellitus with hyperglycemia: Secondary | ICD-10-CM | POA: Diagnosis not present

## 2020-04-15 DIAGNOSIS — E7800 Pure hypercholesterolemia, unspecified: Secondary | ICD-10-CM | POA: Diagnosis not present

## 2020-04-15 DIAGNOSIS — I10 Essential (primary) hypertension: Secondary | ICD-10-CM | POA: Diagnosis not present

## 2020-04-18 DIAGNOSIS — H02052 Trichiasis without entropian right lower eyelid: Secondary | ICD-10-CM | POA: Diagnosis not present

## 2020-04-18 DIAGNOSIS — H401111 Primary open-angle glaucoma, right eye, mild stage: Secondary | ICD-10-CM | POA: Diagnosis not present

## 2020-04-18 DIAGNOSIS — H40052 Ocular hypertension, left eye: Secondary | ICD-10-CM | POA: Diagnosis not present

## 2020-06-10 DIAGNOSIS — E7800 Pure hypercholesterolemia, unspecified: Secondary | ICD-10-CM | POA: Diagnosis not present

## 2020-06-10 DIAGNOSIS — E119 Type 2 diabetes mellitus without complications: Secondary | ICD-10-CM | POA: Diagnosis not present

## 2020-06-10 DIAGNOSIS — E1165 Type 2 diabetes mellitus with hyperglycemia: Secondary | ICD-10-CM | POA: Diagnosis not present

## 2020-06-10 DIAGNOSIS — I10 Essential (primary) hypertension: Secondary | ICD-10-CM | POA: Diagnosis not present

## 2020-06-24 DIAGNOSIS — L720 Epidermal cyst: Secondary | ICD-10-CM | POA: Diagnosis not present

## 2020-06-24 DIAGNOSIS — L821 Other seborrheic keratosis: Secondary | ICD-10-CM | POA: Diagnosis not present

## 2020-06-24 DIAGNOSIS — L814 Other melanin hyperpigmentation: Secondary | ICD-10-CM | POA: Diagnosis not present

## 2020-06-24 DIAGNOSIS — X32XXXS Exposure to sunlight, sequela: Secondary | ICD-10-CM | POA: Diagnosis not present

## 2020-06-24 DIAGNOSIS — Z85828 Personal history of other malignant neoplasm of skin: Secondary | ICD-10-CM | POA: Diagnosis not present

## 2020-06-24 DIAGNOSIS — L578 Other skin changes due to chronic exposure to nonionizing radiation: Secondary | ICD-10-CM | POA: Diagnosis not present

## 2020-06-24 DIAGNOSIS — W908XXS Exposure to other nonionizing radiation, sequela: Secondary | ICD-10-CM | POA: Diagnosis not present

## 2020-07-12 DIAGNOSIS — I10 Essential (primary) hypertension: Secondary | ICD-10-CM | POA: Diagnosis not present

## 2020-07-12 DIAGNOSIS — E119 Type 2 diabetes mellitus without complications: Secondary | ICD-10-CM | POA: Diagnosis not present

## 2020-07-12 DIAGNOSIS — E7800 Pure hypercholesterolemia, unspecified: Secondary | ICD-10-CM | POA: Diagnosis not present

## 2020-07-12 DIAGNOSIS — G40909 Epilepsy, unspecified, not intractable, without status epilepticus: Secondary | ICD-10-CM | POA: Diagnosis not present

## 2020-07-12 DIAGNOSIS — Z0000 Encounter for general adult medical examination without abnormal findings: Secondary | ICD-10-CM | POA: Diagnosis not present

## 2020-07-12 DIAGNOSIS — R0602 Shortness of breath: Secondary | ICD-10-CM | POA: Diagnosis not present

## 2020-08-01 DIAGNOSIS — I2510 Atherosclerotic heart disease of native coronary artery without angina pectoris: Secondary | ICD-10-CM | POA: Diagnosis not present

## 2020-08-01 DIAGNOSIS — Z951 Presence of aortocoronary bypass graft: Secondary | ICD-10-CM | POA: Diagnosis not present

## 2020-08-01 DIAGNOSIS — Z7982 Long term (current) use of aspirin: Secondary | ICD-10-CM | POA: Diagnosis not present

## 2020-08-20 DIAGNOSIS — L249 Irritant contact dermatitis, unspecified cause: Secondary | ICD-10-CM | POA: Diagnosis not present

## 2020-08-29 DIAGNOSIS — E1165 Type 2 diabetes mellitus with hyperglycemia: Secondary | ICD-10-CM | POA: Diagnosis not present

## 2020-08-29 DIAGNOSIS — I10 Essential (primary) hypertension: Secondary | ICD-10-CM | POA: Diagnosis not present

## 2020-08-29 DIAGNOSIS — E119 Type 2 diabetes mellitus without complications: Secondary | ICD-10-CM | POA: Diagnosis not present

## 2020-08-29 DIAGNOSIS — E7800 Pure hypercholesterolemia, unspecified: Secondary | ICD-10-CM | POA: Diagnosis not present

## 2020-09-04 DIAGNOSIS — I083 Combined rheumatic disorders of mitral, aortic and tricuspid valves: Secondary | ICD-10-CM | POA: Diagnosis not present

## 2020-09-04 DIAGNOSIS — I2510 Atherosclerotic heart disease of native coronary artery without angina pectoris: Secondary | ICD-10-CM | POA: Diagnosis not present

## 2020-09-04 DIAGNOSIS — Z951 Presence of aortocoronary bypass graft: Secondary | ICD-10-CM | POA: Diagnosis not present

## 2020-09-04 DIAGNOSIS — R0602 Shortness of breath: Secondary | ICD-10-CM | POA: Diagnosis not present

## 2020-09-20 DIAGNOSIS — N189 Chronic kidney disease, unspecified: Secondary | ICD-10-CM | POA: Diagnosis not present

## 2020-09-20 DIAGNOSIS — E1169 Type 2 diabetes mellitus with other specified complication: Secondary | ICD-10-CM | POA: Diagnosis not present

## 2020-09-20 DIAGNOSIS — I10 Essential (primary) hypertension: Secondary | ICD-10-CM | POA: Diagnosis not present

## 2020-09-20 DIAGNOSIS — E669 Obesity, unspecified: Secondary | ICD-10-CM | POA: Diagnosis not present

## 2020-09-20 DIAGNOSIS — R309 Painful micturition, unspecified: Secondary | ICD-10-CM | POA: Diagnosis not present

## 2020-09-20 DIAGNOSIS — E782 Mixed hyperlipidemia: Secondary | ICD-10-CM | POA: Diagnosis not present

## 2020-09-20 DIAGNOSIS — R809 Proteinuria, unspecified: Secondary | ICD-10-CM | POA: Diagnosis not present

## 2020-09-20 DIAGNOSIS — D649 Anemia, unspecified: Secondary | ICD-10-CM | POA: Diagnosis not present

## 2020-09-20 DIAGNOSIS — E559 Vitamin D deficiency, unspecified: Secondary | ICD-10-CM | POA: Diagnosis not present

## 2020-10-16 DIAGNOSIS — H40052 Ocular hypertension, left eye: Secondary | ICD-10-CM | POA: Diagnosis not present

## 2020-10-16 DIAGNOSIS — H0288A Meibomian gland dysfunction right eye, upper and lower eyelids: Secondary | ICD-10-CM | POA: Diagnosis not present

## 2020-10-16 DIAGNOSIS — H18413 Arcus senilis, bilateral: Secondary | ICD-10-CM | POA: Diagnosis not present

## 2020-10-16 DIAGNOSIS — H401111 Primary open-angle glaucoma, right eye, mild stage: Secondary | ICD-10-CM | POA: Diagnosis not present

## 2020-10-16 DIAGNOSIS — H0288B Meibomian gland dysfunction left eye, upper and lower eyelids: Secondary | ICD-10-CM | POA: Diagnosis not present

## 2020-10-16 DIAGNOSIS — Z7984 Long term (current) use of oral hypoglycemic drugs: Secondary | ICD-10-CM | POA: Diagnosis not present

## 2020-10-16 DIAGNOSIS — Z961 Presence of intraocular lens: Secondary | ICD-10-CM | POA: Diagnosis not present

## 2020-10-16 DIAGNOSIS — H02052 Trichiasis without entropian right lower eyelid: Secondary | ICD-10-CM | POA: Diagnosis not present

## 2020-10-16 DIAGNOSIS — E119 Type 2 diabetes mellitus without complications: Secondary | ICD-10-CM | POA: Diagnosis not present

## 2020-10-17 DIAGNOSIS — E7800 Pure hypercholesterolemia, unspecified: Secondary | ICD-10-CM | POA: Diagnosis not present

## 2020-10-17 DIAGNOSIS — I25810 Atherosclerosis of coronary artery bypass graft(s) without angina pectoris: Secondary | ICD-10-CM | POA: Diagnosis not present

## 2020-10-17 DIAGNOSIS — I10 Essential (primary) hypertension: Secondary | ICD-10-CM | POA: Diagnosis not present

## 2020-10-17 DIAGNOSIS — E1165 Type 2 diabetes mellitus with hyperglycemia: Secondary | ICD-10-CM | POA: Diagnosis not present

## 2020-10-17 DIAGNOSIS — E119 Type 2 diabetes mellitus without complications: Secondary | ICD-10-CM | POA: Diagnosis not present

## 2020-11-26 DIAGNOSIS — C4442 Squamous cell carcinoma of skin of scalp and neck: Secondary | ICD-10-CM | POA: Diagnosis not present

## 2020-11-26 DIAGNOSIS — L570 Actinic keratosis: Secondary | ICD-10-CM | POA: Diagnosis not present

## 2020-11-26 DIAGNOSIS — D485 Neoplasm of uncertain behavior of skin: Secondary | ICD-10-CM | POA: Diagnosis not present

## 2020-12-16 DIAGNOSIS — C4442 Squamous cell carcinoma of skin of scalp and neck: Secondary | ICD-10-CM | POA: Diagnosis not present

## 2020-12-16 DIAGNOSIS — Z85828 Personal history of other malignant neoplasm of skin: Secondary | ICD-10-CM | POA: Diagnosis not present

## 2020-12-23 DIAGNOSIS — L905 Scar conditions and fibrosis of skin: Secondary | ICD-10-CM | POA: Diagnosis not present

## 2020-12-30 DIAGNOSIS — L905 Scar conditions and fibrosis of skin: Secondary | ICD-10-CM | POA: Diagnosis not present

## 2021-01-10 DIAGNOSIS — E7800 Pure hypercholesterolemia, unspecified: Secondary | ICD-10-CM | POA: Diagnosis not present

## 2021-01-10 DIAGNOSIS — Z23 Encounter for immunization: Secondary | ICD-10-CM | POA: Diagnosis not present

## 2021-01-10 DIAGNOSIS — G40909 Epilepsy, unspecified, not intractable, without status epilepticus: Secondary | ICD-10-CM | POA: Diagnosis not present

## 2021-01-10 DIAGNOSIS — I10 Essential (primary) hypertension: Secondary | ICD-10-CM | POA: Diagnosis not present

## 2021-01-10 DIAGNOSIS — Z125 Encounter for screening for malignant neoplasm of prostate: Secondary | ICD-10-CM | POA: Diagnosis not present

## 2021-01-10 DIAGNOSIS — H401111 Primary open-angle glaucoma, right eye, mild stage: Secondary | ICD-10-CM | POA: Diagnosis not present

## 2021-01-10 DIAGNOSIS — E119 Type 2 diabetes mellitus without complications: Secondary | ICD-10-CM | POA: Diagnosis not present

## 2021-01-10 DIAGNOSIS — I25810 Atherosclerosis of coronary artery bypass graft(s) without angina pectoris: Secondary | ICD-10-CM | POA: Diagnosis not present

## 2021-01-10 DIAGNOSIS — Z6841 Body Mass Index (BMI) 40.0 and over, adult: Secondary | ICD-10-CM | POA: Diagnosis not present

## 2021-02-20 DIAGNOSIS — H401111 Primary open-angle glaucoma, right eye, mild stage: Secondary | ICD-10-CM | POA: Diagnosis not present

## 2021-02-20 DIAGNOSIS — H40052 Ocular hypertension, left eye: Secondary | ICD-10-CM | POA: Diagnosis not present

## 2021-03-03 DIAGNOSIS — L905 Scar conditions and fibrosis of skin: Secondary | ICD-10-CM | POA: Diagnosis not present

## 2021-03-03 DIAGNOSIS — L570 Actinic keratosis: Secondary | ICD-10-CM | POA: Diagnosis not present

## 2021-04-15 DIAGNOSIS — D1801 Hemangioma of skin and subcutaneous tissue: Secondary | ICD-10-CM | POA: Diagnosis not present

## 2021-04-15 DIAGNOSIS — L853 Xerosis cutis: Secondary | ICD-10-CM | POA: Diagnosis not present

## 2021-04-15 DIAGNOSIS — D485 Neoplasm of uncertain behavior of skin: Secondary | ICD-10-CM | POA: Diagnosis not present

## 2021-04-15 DIAGNOSIS — Z85828 Personal history of other malignant neoplasm of skin: Secondary | ICD-10-CM | POA: Diagnosis not present

## 2021-04-15 DIAGNOSIS — L821 Other seborrheic keratosis: Secondary | ICD-10-CM | POA: Diagnosis not present

## 2021-04-15 DIAGNOSIS — X32XXXS Exposure to sunlight, sequela: Secondary | ICD-10-CM | POA: Diagnosis not present

## 2021-04-15 DIAGNOSIS — L820 Inflamed seborrheic keratosis: Secondary | ICD-10-CM | POA: Diagnosis not present

## 2021-04-15 DIAGNOSIS — L814 Other melanin hyperpigmentation: Secondary | ICD-10-CM | POA: Diagnosis not present

## 2021-04-24 DIAGNOSIS — J189 Pneumonia, unspecified organism: Secondary | ICD-10-CM | POA: Diagnosis not present

## 2021-07-08 DIAGNOSIS — H401111 Primary open-angle glaucoma, right eye, mild stage: Secondary | ICD-10-CM | POA: Diagnosis not present

## 2021-07-08 DIAGNOSIS — H40052 Ocular hypertension, left eye: Secondary | ICD-10-CM | POA: Diagnosis not present

## 2021-07-08 DIAGNOSIS — H02052 Trichiasis without entropian right lower eyelid: Secondary | ICD-10-CM | POA: Diagnosis not present

## 2021-07-17 DIAGNOSIS — I10 Essential (primary) hypertension: Secondary | ICD-10-CM | POA: Diagnosis not present

## 2021-07-17 DIAGNOSIS — G40909 Epilepsy, unspecified, not intractable, without status epilepticus: Secondary | ICD-10-CM | POA: Diagnosis not present

## 2021-07-17 DIAGNOSIS — Z0000 Encounter for general adult medical examination without abnormal findings: Secondary | ICD-10-CM | POA: Diagnosis not present

## 2021-07-17 DIAGNOSIS — E7800 Pure hypercholesterolemia, unspecified: Secondary | ICD-10-CM | POA: Diagnosis not present

## 2021-07-17 DIAGNOSIS — Z23 Encounter for immunization: Secondary | ICD-10-CM | POA: Diagnosis not present

## 2021-07-17 DIAGNOSIS — E1165 Type 2 diabetes mellitus with hyperglycemia: Secondary | ICD-10-CM | POA: Diagnosis not present

## 2021-11-12 DIAGNOSIS — H0288A Meibomian gland dysfunction right eye, upper and lower eyelids: Secondary | ICD-10-CM | POA: Diagnosis not present

## 2021-11-12 DIAGNOSIS — Z7984 Long term (current) use of oral hypoglycemic drugs: Secondary | ICD-10-CM | POA: Diagnosis not present

## 2021-11-12 DIAGNOSIS — Z961 Presence of intraocular lens: Secondary | ICD-10-CM | POA: Diagnosis not present

## 2021-11-12 DIAGNOSIS — H40052 Ocular hypertension, left eye: Secondary | ICD-10-CM | POA: Diagnosis not present

## 2021-11-12 DIAGNOSIS — H3589 Other specified retinal disorders: Secondary | ICD-10-CM | POA: Diagnosis not present

## 2021-11-12 DIAGNOSIS — H02052 Trichiasis without entropian right lower eyelid: Secondary | ICD-10-CM | POA: Diagnosis not present

## 2021-11-12 DIAGNOSIS — H401111 Primary open-angle glaucoma, right eye, mild stage: Secondary | ICD-10-CM | POA: Diagnosis not present

## 2021-11-12 DIAGNOSIS — H18413 Arcus senilis, bilateral: Secondary | ICD-10-CM | POA: Diagnosis not present

## 2021-11-12 DIAGNOSIS — E113293 Type 2 diabetes mellitus with mild nonproliferative diabetic retinopathy without macular edema, bilateral: Secondary | ICD-10-CM | POA: Diagnosis not present

## 2021-11-12 DIAGNOSIS — E119 Type 2 diabetes mellitus without complications: Secondary | ICD-10-CM | POA: Diagnosis not present

## 2022-01-20 DIAGNOSIS — E7800 Pure hypercholesterolemia, unspecified: Secondary | ICD-10-CM | POA: Diagnosis not present

## 2022-01-20 DIAGNOSIS — I25810 Atherosclerosis of coronary artery bypass graft(s) without angina pectoris: Secondary | ICD-10-CM | POA: Diagnosis not present

## 2022-01-20 DIAGNOSIS — Z23 Encounter for immunization: Secondary | ICD-10-CM | POA: Diagnosis not present

## 2022-01-20 DIAGNOSIS — G40909 Epilepsy, unspecified, not intractable, without status epilepticus: Secondary | ICD-10-CM | POA: Diagnosis not present

## 2022-01-20 DIAGNOSIS — E119 Type 2 diabetes mellitus without complications: Secondary | ICD-10-CM | POA: Diagnosis not present

## 2022-01-20 DIAGNOSIS — I10 Essential (primary) hypertension: Secondary | ICD-10-CM | POA: Diagnosis not present

## 2022-03-24 DIAGNOSIS — H40052 Ocular hypertension, left eye: Secondary | ICD-10-CM | POA: Diagnosis not present

## 2022-03-24 DIAGNOSIS — H401111 Primary open-angle glaucoma, right eye, mild stage: Secondary | ICD-10-CM | POA: Diagnosis not present

## 2022-04-02 DIAGNOSIS — Z79899 Other long term (current) drug therapy: Secondary | ICD-10-CM | POA: Diagnosis not present

## 2022-04-02 DIAGNOSIS — Z7982 Long term (current) use of aspirin: Secondary | ICD-10-CM | POA: Diagnosis not present

## 2022-04-02 DIAGNOSIS — E785 Hyperlipidemia, unspecified: Secondary | ICD-10-CM | POA: Diagnosis not present

## 2022-04-02 DIAGNOSIS — I10 Essential (primary) hypertension: Secondary | ICD-10-CM | POA: Diagnosis not present

## 2022-04-02 DIAGNOSIS — I2510 Atherosclerotic heart disease of native coronary artery without angina pectoris: Secondary | ICD-10-CM | POA: Diagnosis not present

## 2022-04-16 DIAGNOSIS — D485 Neoplasm of uncertain behavior of skin: Secondary | ICD-10-CM | POA: Diagnosis not present

## 2022-04-16 DIAGNOSIS — D692 Other nonthrombocytopenic purpura: Secondary | ICD-10-CM | POA: Diagnosis not present

## 2022-04-16 DIAGNOSIS — L820 Inflamed seborrheic keratosis: Secondary | ICD-10-CM | POA: Diagnosis not present

## 2022-04-16 DIAGNOSIS — Z85828 Personal history of other malignant neoplasm of skin: Secondary | ICD-10-CM | POA: Diagnosis not present

## 2022-04-16 DIAGNOSIS — L814 Other melanin hyperpigmentation: Secondary | ICD-10-CM | POA: Diagnosis not present

## 2022-04-16 DIAGNOSIS — L821 Other seborrheic keratosis: Secondary | ICD-10-CM | POA: Diagnosis not present

## 2022-04-23 DIAGNOSIS — I517 Cardiomegaly: Secondary | ICD-10-CM | POA: Diagnosis not present

## 2022-04-23 DIAGNOSIS — R0602 Shortness of breath: Secondary | ICD-10-CM | POA: Diagnosis not present

## 2022-04-23 DIAGNOSIS — I358 Other nonrheumatic aortic valve disorders: Secondary | ICD-10-CM | POA: Diagnosis not present

## 2022-07-21 DIAGNOSIS — J40 Bronchitis, not specified as acute or chronic: Secondary | ICD-10-CM | POA: Diagnosis not present

## 2022-07-21 DIAGNOSIS — Z1211 Encounter for screening for malignant neoplasm of colon: Secondary | ICD-10-CM | POA: Diagnosis not present

## 2022-07-21 DIAGNOSIS — G40909 Epilepsy, unspecified, not intractable, without status epilepticus: Secondary | ICD-10-CM | POA: Diagnosis not present

## 2022-07-21 DIAGNOSIS — Z0000 Encounter for general adult medical examination without abnormal findings: Secondary | ICD-10-CM | POA: Diagnosis not present

## 2022-07-21 DIAGNOSIS — E1165 Type 2 diabetes mellitus with hyperglycemia: Secondary | ICD-10-CM | POA: Diagnosis not present

## 2022-07-21 DIAGNOSIS — E1121 Type 2 diabetes mellitus with diabetic nephropathy: Secondary | ICD-10-CM | POA: Diagnosis not present

## 2022-07-21 DIAGNOSIS — E119 Type 2 diabetes mellitus without complications: Secondary | ICD-10-CM | POA: Diagnosis not present

## 2022-07-21 DIAGNOSIS — R310 Gross hematuria: Secondary | ICD-10-CM | POA: Diagnosis not present

## 2022-07-21 DIAGNOSIS — E7800 Pure hypercholesterolemia, unspecified: Secondary | ICD-10-CM | POA: Diagnosis not present

## 2022-07-21 DIAGNOSIS — I10 Essential (primary) hypertension: Secondary | ICD-10-CM | POA: Diagnosis not present

## 2022-08-28 DIAGNOSIS — R3914 Feeling of incomplete bladder emptying: Secondary | ICD-10-CM | POA: Diagnosis not present

## 2022-09-01 DIAGNOSIS — E782 Mixed hyperlipidemia: Secondary | ICD-10-CM | POA: Diagnosis not present

## 2022-09-01 DIAGNOSIS — E669 Obesity, unspecified: Secondary | ICD-10-CM | POA: Diagnosis not present

## 2022-09-01 DIAGNOSIS — I10 Essential (primary) hypertension: Secondary | ICD-10-CM | POA: Diagnosis not present

## 2022-09-01 DIAGNOSIS — E1169 Type 2 diabetes mellitus with other specified complication: Secondary | ICD-10-CM | POA: Diagnosis not present

## 2022-09-08 DIAGNOSIS — R339 Retention of urine, unspecified: Secondary | ICD-10-CM | POA: Diagnosis not present

## 2022-09-08 DIAGNOSIS — N35919 Unspecified urethral stricture, male, unspecified site: Secondary | ICD-10-CM | POA: Diagnosis not present

## 2022-09-15 DIAGNOSIS — H02052 Trichiasis without entropian right lower eyelid: Secondary | ICD-10-CM | POA: Diagnosis not present

## 2022-09-15 DIAGNOSIS — H0288B Meibomian gland dysfunction left eye, upper and lower eyelids: Secondary | ICD-10-CM | POA: Diagnosis not present

## 2022-09-15 DIAGNOSIS — H0288A Meibomian gland dysfunction right eye, upper and lower eyelids: Secondary | ICD-10-CM | POA: Diagnosis not present

## 2022-09-15 DIAGNOSIS — H40052 Ocular hypertension, left eye: Secondary | ICD-10-CM | POA: Diagnosis not present

## 2022-09-15 DIAGNOSIS — H401111 Primary open-angle glaucoma, right eye, mild stage: Secondary | ICD-10-CM | POA: Diagnosis not present

## 2022-10-01 DIAGNOSIS — Z951 Presence of aortocoronary bypass graft: Secondary | ICD-10-CM | POA: Diagnosis not present

## 2022-10-01 DIAGNOSIS — E785 Hyperlipidemia, unspecified: Secondary | ICD-10-CM | POA: Diagnosis not present

## 2022-10-01 DIAGNOSIS — R0602 Shortness of breath: Secondary | ICD-10-CM | POA: Diagnosis not present

## 2022-10-01 DIAGNOSIS — I2510 Atherosclerotic heart disease of native coronary artery without angina pectoris: Secondary | ICD-10-CM | POA: Diagnosis not present

## 2022-10-01 DIAGNOSIS — Z9989 Dependence on other enabling machines and devices: Secondary | ICD-10-CM | POA: Diagnosis not present

## 2022-10-01 DIAGNOSIS — I10 Essential (primary) hypertension: Secondary | ICD-10-CM | POA: Diagnosis not present

## 2022-10-01 DIAGNOSIS — G4733 Obstructive sleep apnea (adult) (pediatric): Secondary | ICD-10-CM | POA: Diagnosis not present

## 2022-10-01 DIAGNOSIS — Z7982 Long term (current) use of aspirin: Secondary | ICD-10-CM | POA: Diagnosis not present

## 2022-10-12 DIAGNOSIS — R338 Other retention of urine: Secondary | ICD-10-CM | POA: Diagnosis not present

## 2022-11-03 DIAGNOSIS — D124 Benign neoplasm of descending colon: Secondary | ICD-10-CM | POA: Diagnosis not present

## 2022-11-03 DIAGNOSIS — D122 Benign neoplasm of ascending colon: Secondary | ICD-10-CM | POA: Diagnosis not present

## 2022-11-03 DIAGNOSIS — Z09 Encounter for follow-up examination after completed treatment for conditions other than malignant neoplasm: Secondary | ICD-10-CM | POA: Diagnosis not present

## 2022-11-03 DIAGNOSIS — K5730 Diverticulosis of large intestine without perforation or abscess without bleeding: Secondary | ICD-10-CM | POA: Diagnosis not present

## 2022-11-03 DIAGNOSIS — Z86010 Personal history of colonic polyps: Secondary | ICD-10-CM | POA: Diagnosis not present

## 2022-11-03 DIAGNOSIS — K648 Other hemorrhoids: Secondary | ICD-10-CM | POA: Diagnosis not present

## 2022-11-05 DIAGNOSIS — D122 Benign neoplasm of ascending colon: Secondary | ICD-10-CM | POA: Diagnosis not present

## 2022-11-05 DIAGNOSIS — D124 Benign neoplasm of descending colon: Secondary | ICD-10-CM | POA: Diagnosis not present

## 2022-11-19 DIAGNOSIS — R338 Other retention of urine: Secondary | ICD-10-CM | POA: Diagnosis not present

## 2022-12-07 DIAGNOSIS — R339 Retention of urine, unspecified: Secondary | ICD-10-CM | POA: Diagnosis not present

## 2022-12-07 DIAGNOSIS — N35919 Unspecified urethral stricture, male, unspecified site: Secondary | ICD-10-CM | POA: Diagnosis not present

## 2022-12-15 DIAGNOSIS — R809 Proteinuria, unspecified: Secondary | ICD-10-CM | POA: Diagnosis not present

## 2022-12-15 DIAGNOSIS — R309 Painful micturition, unspecified: Secondary | ICD-10-CM | POA: Diagnosis not present

## 2022-12-18 DIAGNOSIS — R3914 Feeling of incomplete bladder emptying: Secondary | ICD-10-CM | POA: Diagnosis not present

## 2023-01-05 DIAGNOSIS — N189 Chronic kidney disease, unspecified: Secondary | ICD-10-CM | POA: Diagnosis not present

## 2023-01-05 DIAGNOSIS — R809 Proteinuria, unspecified: Secondary | ICD-10-CM | POA: Diagnosis not present

## 2023-01-05 DIAGNOSIS — D6489 Other specified anemias: Secondary | ICD-10-CM | POA: Diagnosis not present

## 2023-01-05 DIAGNOSIS — E559 Vitamin D deficiency, unspecified: Secondary | ICD-10-CM | POA: Diagnosis not present

## 2023-02-08 DIAGNOSIS — H18413 Arcus senilis, bilateral: Secondary | ICD-10-CM | POA: Diagnosis not present

## 2023-02-08 DIAGNOSIS — H0288A Meibomian gland dysfunction right eye, upper and lower eyelids: Secondary | ICD-10-CM | POA: Diagnosis not present

## 2023-02-08 DIAGNOSIS — Z961 Presence of intraocular lens: Secondary | ICD-10-CM | POA: Diagnosis not present

## 2023-02-08 DIAGNOSIS — H40052 Ocular hypertension, left eye: Secondary | ICD-10-CM | POA: Diagnosis not present

## 2023-02-08 DIAGNOSIS — H0288B Meibomian gland dysfunction left eye, upper and lower eyelids: Secondary | ICD-10-CM | POA: Diagnosis not present

## 2023-02-08 DIAGNOSIS — E119 Type 2 diabetes mellitus without complications: Secondary | ICD-10-CM | POA: Diagnosis not present

## 2023-02-08 DIAGNOSIS — H401111 Primary open-angle glaucoma, right eye, mild stage: Secondary | ICD-10-CM | POA: Diagnosis not present

## 2023-02-08 DIAGNOSIS — H5213 Myopia, bilateral: Secondary | ICD-10-CM | POA: Diagnosis not present

## 2023-02-08 DIAGNOSIS — Z7984 Long term (current) use of oral hypoglycemic drugs: Secondary | ICD-10-CM | POA: Diagnosis not present

## 2023-02-25 DIAGNOSIS — R339 Retention of urine, unspecified: Secondary | ICD-10-CM | POA: Diagnosis not present

## 2023-02-25 DIAGNOSIS — N35919 Unspecified urethral stricture, male, unspecified site: Secondary | ICD-10-CM | POA: Diagnosis not present

## 2023-02-26 DIAGNOSIS — H5213 Myopia, bilateral: Secondary | ICD-10-CM | POA: Diagnosis not present

## 2023-02-26 DIAGNOSIS — Z0100 Encounter for examination of eyes and vision without abnormal findings: Secondary | ICD-10-CM | POA: Diagnosis not present

## 2023-04-15 DIAGNOSIS — I10 Essential (primary) hypertension: Secondary | ICD-10-CM | POA: Diagnosis not present

## 2023-04-27 DIAGNOSIS — L853 Xerosis cutis: Secondary | ICD-10-CM | POA: Diagnosis not present

## 2023-04-27 DIAGNOSIS — L821 Other seborrheic keratosis: Secondary | ICD-10-CM | POA: Diagnosis not present

## 2023-04-27 DIAGNOSIS — D0422 Carcinoma in situ of skin of left ear and external auricular canal: Secondary | ICD-10-CM | POA: Diagnosis not present

## 2023-04-27 DIAGNOSIS — L570 Actinic keratosis: Secondary | ICD-10-CM | POA: Diagnosis not present

## 2023-04-27 DIAGNOSIS — D485 Neoplasm of uncertain behavior of skin: Secondary | ICD-10-CM | POA: Diagnosis not present

## 2023-04-27 DIAGNOSIS — L814 Other melanin hyperpigmentation: Secondary | ICD-10-CM | POA: Diagnosis not present

## 2023-05-25 DIAGNOSIS — N35919 Unspecified urethral stricture, male, unspecified site: Secondary | ICD-10-CM | POA: Diagnosis not present

## 2023-05-25 DIAGNOSIS — R339 Retention of urine, unspecified: Secondary | ICD-10-CM | POA: Diagnosis not present

## 2023-06-07 DIAGNOSIS — D0422 Carcinoma in situ of skin of left ear and external auricular canal: Secondary | ICD-10-CM | POA: Diagnosis not present

## 2023-06-23 DIAGNOSIS — H40052 Ocular hypertension, left eye: Secondary | ICD-10-CM | POA: Diagnosis not present

## 2023-06-23 DIAGNOSIS — H401111 Primary open-angle glaucoma, right eye, mild stage: Secondary | ICD-10-CM | POA: Diagnosis not present

## 2023-07-08 DIAGNOSIS — E669 Obesity, unspecified: Secondary | ICD-10-CM | POA: Diagnosis not present

## 2023-07-08 DIAGNOSIS — E782 Mixed hyperlipidemia: Secondary | ICD-10-CM | POA: Diagnosis not present

## 2023-07-08 DIAGNOSIS — I10 Essential (primary) hypertension: Secondary | ICD-10-CM | POA: Diagnosis not present

## 2023-07-08 DIAGNOSIS — R809 Proteinuria, unspecified: Secondary | ICD-10-CM | POA: Diagnosis not present

## 2023-07-08 DIAGNOSIS — E1169 Type 2 diabetes mellitus with other specified complication: Secondary | ICD-10-CM | POA: Diagnosis not present

## 2023-07-08 DIAGNOSIS — N189 Chronic kidney disease, unspecified: Secondary | ICD-10-CM | POA: Diagnosis not present

## 2023-07-08 DIAGNOSIS — E559 Vitamin D deficiency, unspecified: Secondary | ICD-10-CM | POA: Diagnosis not present

## 2023-07-08 DIAGNOSIS — D6489 Other specified anemias: Secondary | ICD-10-CM | POA: Diagnosis not present

## 2023-07-08 DIAGNOSIS — R309 Painful micturition, unspecified: Secondary | ICD-10-CM | POA: Diagnosis not present

## 2023-07-19 DIAGNOSIS — R309 Painful micturition, unspecified: Secondary | ICD-10-CM | POA: Diagnosis not present

## 2023-07-19 DIAGNOSIS — N189 Chronic kidney disease, unspecified: Secondary | ICD-10-CM | POA: Diagnosis not present

## 2023-07-19 DIAGNOSIS — E559 Vitamin D deficiency, unspecified: Secondary | ICD-10-CM | POA: Diagnosis not present

## 2023-07-19 DIAGNOSIS — R809 Proteinuria, unspecified: Secondary | ICD-10-CM | POA: Diagnosis not present

## 2023-07-19 DIAGNOSIS — D6489 Other specified anemias: Secondary | ICD-10-CM | POA: Diagnosis not present

## 2023-08-12 DIAGNOSIS — G40909 Epilepsy, unspecified, not intractable, without status epilepticus: Secondary | ICD-10-CM | POA: Diagnosis not present

## 2023-08-12 DIAGNOSIS — E1142 Type 2 diabetes mellitus with diabetic polyneuropathy: Secondary | ICD-10-CM | POA: Diagnosis not present

## 2023-08-12 DIAGNOSIS — N401 Enlarged prostate with lower urinary tract symptoms: Secondary | ICD-10-CM | POA: Diagnosis not present

## 2023-08-12 DIAGNOSIS — L84 Corns and callosities: Secondary | ICD-10-CM | POA: Diagnosis not present

## 2023-08-12 DIAGNOSIS — E7800 Pure hypercholesterolemia, unspecified: Secondary | ICD-10-CM | POA: Diagnosis not present

## 2023-08-12 DIAGNOSIS — H401111 Primary open-angle glaucoma, right eye, mild stage: Secondary | ICD-10-CM | POA: Diagnosis not present

## 2023-08-12 DIAGNOSIS — I10 Essential (primary) hypertension: Secondary | ICD-10-CM | POA: Diagnosis not present

## 2023-08-12 DIAGNOSIS — N138 Other obstructive and reflux uropathy: Secondary | ICD-10-CM | POA: Diagnosis not present

## 2023-08-12 DIAGNOSIS — Z0000 Encounter for general adult medical examination without abnormal findings: Secondary | ICD-10-CM | POA: Diagnosis not present

## 2023-08-23 ENCOUNTER — Ambulatory Visit: Admitting: Podiatry

## 2023-08-23 ENCOUNTER — Ambulatory Visit (INDEPENDENT_AMBULATORY_CARE_PROVIDER_SITE_OTHER)

## 2023-08-23 ENCOUNTER — Encounter: Payer: Self-pay | Admitting: Podiatry

## 2023-08-23 VITALS — Ht 68.0 in | Wt 275.0 lb

## 2023-08-23 DIAGNOSIS — E1140 Type 2 diabetes mellitus with diabetic neuropathy, unspecified: Secondary | ICD-10-CM | POA: Diagnosis not present

## 2023-08-23 DIAGNOSIS — M79674 Pain in right toe(s): Secondary | ICD-10-CM | POA: Diagnosis not present

## 2023-08-23 DIAGNOSIS — M79675 Pain in left toe(s): Secondary | ICD-10-CM | POA: Diagnosis not present

## 2023-08-23 DIAGNOSIS — B351 Tinea unguium: Secondary | ICD-10-CM

## 2023-08-23 DIAGNOSIS — E1149 Type 2 diabetes mellitus with other diabetic neurological complication: Secondary | ICD-10-CM

## 2023-08-23 DIAGNOSIS — L97522 Non-pressure chronic ulcer of other part of left foot with fat layer exposed: Secondary | ICD-10-CM | POA: Diagnosis not present

## 2023-08-23 MED ORDER — DOXYCYCLINE HYCLATE 100 MG PO TABS: 100.0000 mg | ORAL_TABLET | Freq: Two times a day (BID) | ORAL | 1 refills | Status: AC

## 2023-08-25 NOTE — Progress Notes (Signed)
 Subjective:   Patient ID: Edward Rojas, male   DOB: 78 y.o.   MRN: [id number]   HPI Patient presents with caregiver with a broken down lesion plantar aspect left fifth metatarsal that is gotten somewhat red around the area and a little bit painful over the last few weeks with keratotic tissue formation also noted right.  Moderate obesity noted diabetes that appears to be under good control but they are not good historians.  Patient does not smoke is not active   Review of Systems  All other systems reviewed and are negative.       Objective:  Physical Exam Vitals and nursing note reviewed.  Constitutional:      Appearance: He is well-developed.  Pulmonary:     Effort: Pulmonary effort is normal.  Musculoskeletal:        General: Normal range of motion.  Skin:    General: Skin is warm.  Neurological:     Mental Status: He is alert.     Neurovascular status was found to be intact with diminished vibratory and sharp dull.  Patient has a lot of stress fifth metatarsal head left has slight breakdown of tissue plantar aspect left foot metatarsal with upon debridement low-grade breakdown of skin and slight subcutaneous exposure no bone no tendon exposure.  Patient has slight redness around the area extending about a centimeter and there was no active drainage noted.  Good digital perfusion well-oriented     Assessment:  Ulceration relatively new left fifth metatarsal that appears to be relatively static at the current time     Plan:  H&P x-ray reviewed and at this point I went ahead and I debrided tissue I flushed the area and I then applied Iodosorb with sterile dressing and cushioning around it and I want him to start wet-to-dry dressings explaining to his caregiver daily and continue to wear padding when he walks on his foot to offload weight.  Placed on doxycycline strict instructions of any increased redness were to occur or pathology that he needs to go straight to the hospital and  he does run the risk of amputation with this condition  X-rays were negative for signs of osteomyelitis or lysis concerning this condition with no indications of soft tissue bubbling currently

## 2023-08-26 DIAGNOSIS — R339 Retention of urine, unspecified: Secondary | ICD-10-CM | POA: Diagnosis not present

## 2023-08-26 DIAGNOSIS — N35919 Unspecified urethral stricture, male, unspecified site: Secondary | ICD-10-CM | POA: Diagnosis not present

## 2023-09-06 ENCOUNTER — Ambulatory Visit: Admitting: Podiatry

## 2023-09-06 ENCOUNTER — Encounter: Payer: Self-pay | Admitting: Podiatry

## 2023-09-06 DIAGNOSIS — E1149 Type 2 diabetes mellitus with other diabetic neurological complication: Secondary | ICD-10-CM

## 2023-09-06 DIAGNOSIS — L97522 Non-pressure chronic ulcer of other part of left foot with fat layer exposed: Secondary | ICD-10-CM | POA: Diagnosis not present

## 2023-09-06 DIAGNOSIS — E1140 Type 2 diabetes mellitus with diabetic neuropathy, unspecified: Secondary | ICD-10-CM

## 2023-09-08 NOTE — Progress Notes (Signed)
 Subjective:   Patient ID: Edward Rojas, male   DOB: 78 y.o.   MRN: [id number]   HPI Patient states it is slowly improving I am still having a slight amount of drainage and crusted tissue but much better than it was several weeks ago   ROS      Objective:  Physical Exam  Neurovascular status intact with patient having an ulceration left fifth metatarsal head that does appear to be improving with slight bit of breakdown of tissue no active drainage no subcutaneous exposure currently     Assessment:  Ulceration left fifth metatarsal gradually improving     Plan:  Sharp sterile debridement of tissue advised on continued offloading with padding continued soaks and wet-to-dry dressings and this should heal uneventfully over the next couple weeks.  Dispensed padding to offload weight and instructed him and caregiver on how to properly take care of this and do the wet-to-dry dressings at home

## 2023-09-21 DIAGNOSIS — L905 Scar conditions and fibrosis of skin: Secondary | ICD-10-CM | POA: Diagnosis not present

## 2023-10-18 DIAGNOSIS — R338 Other retention of urine: Secondary | ICD-10-CM | POA: Diagnosis not present

## 2023-11-03 DIAGNOSIS — X32XXXS Exposure to sunlight, sequela: Secondary | ICD-10-CM | POA: Diagnosis not present

## 2023-11-03 DIAGNOSIS — C44319 Basal cell carcinoma of skin of other parts of face: Secondary | ICD-10-CM | POA: Diagnosis not present

## 2023-11-03 DIAGNOSIS — L821 Other seborrheic keratosis: Secondary | ICD-10-CM | POA: Diagnosis not present

## 2023-11-03 DIAGNOSIS — D0462 Carcinoma in situ of skin of left upper limb, including shoulder: Secondary | ICD-10-CM | POA: Diagnosis not present

## 2023-11-03 DIAGNOSIS — L570 Actinic keratosis: Secondary | ICD-10-CM | POA: Diagnosis not present

## 2023-11-03 DIAGNOSIS — D485 Neoplasm of uncertain behavior of skin: Secondary | ICD-10-CM | POA: Diagnosis not present

## 2023-11-03 DIAGNOSIS — L814 Other melanin hyperpigmentation: Secondary | ICD-10-CM | POA: Diagnosis not present

## 2023-11-03 DIAGNOSIS — Z85828 Personal history of other malignant neoplasm of skin: Secondary | ICD-10-CM | POA: Diagnosis not present

## 2023-11-03 DIAGNOSIS — C4441 Basal cell carcinoma of skin of scalp and neck: Secondary | ICD-10-CM | POA: Diagnosis not present

## 2023-11-10 DIAGNOSIS — D0462 Carcinoma in situ of skin of left upper limb, including shoulder: Secondary | ICD-10-CM | POA: Diagnosis not present

## 2023-11-17 DIAGNOSIS — H40052 Ocular hypertension, left eye: Secondary | ICD-10-CM | POA: Diagnosis not present

## 2023-11-17 DIAGNOSIS — H401111 Primary open-angle glaucoma, right eye, mild stage: Secondary | ICD-10-CM | POA: Diagnosis not present

## 2023-11-26 DIAGNOSIS — R339 Retention of urine, unspecified: Secondary | ICD-10-CM | POA: Diagnosis not present

## 2023-11-26 DIAGNOSIS — N35919 Unspecified urethral stricture, male, unspecified site: Secondary | ICD-10-CM | POA: Diagnosis not present

## 2024-01-13 DIAGNOSIS — R809 Proteinuria, unspecified: Secondary | ICD-10-CM | POA: Diagnosis not present

## 2024-01-13 DIAGNOSIS — E1169 Type 2 diabetes mellitus with other specified complication: Secondary | ICD-10-CM | POA: Diagnosis not present

## 2024-01-13 DIAGNOSIS — E669 Obesity, unspecified: Secondary | ICD-10-CM | POA: Diagnosis not present

## 2024-01-13 DIAGNOSIS — R309 Painful micturition, unspecified: Secondary | ICD-10-CM | POA: Diagnosis not present

## 2024-01-13 DIAGNOSIS — E782 Mixed hyperlipidemia: Secondary | ICD-10-CM | POA: Diagnosis not present

## 2024-01-13 DIAGNOSIS — I10 Essential (primary) hypertension: Secondary | ICD-10-CM | POA: Diagnosis not present

## 2024-01-13 DIAGNOSIS — N183 Chronic kidney disease, stage 3 unspecified: Secondary | ICD-10-CM | POA: Diagnosis not present

## 2024-01-17 DIAGNOSIS — R309 Painful micturition, unspecified: Secondary | ICD-10-CM | POA: Diagnosis not present

## 2024-01-17 DIAGNOSIS — R809 Proteinuria, unspecified: Secondary | ICD-10-CM | POA: Diagnosis not present

## 2024-02-21 DIAGNOSIS — C4441 Basal cell carcinoma of skin of scalp and neck: Secondary | ICD-10-CM | POA: Diagnosis not present

## 2024-02-21 DIAGNOSIS — D0422 Carcinoma in situ of skin of left ear and external auricular canal: Secondary | ICD-10-CM | POA: Diagnosis not present

## 2024-02-21 DIAGNOSIS — C44319 Basal cell carcinoma of skin of other parts of face: Secondary | ICD-10-CM | POA: Diagnosis not present
